# Patient Record
Sex: Female | Born: 1965 | Race: Black or African American | Hispanic: No | Marital: Single | Smoking: Current every day smoker
Health system: Southern US, Community
[De-identification: ages and names within clinical notes are randomized; demographics above are authoritative.]

## PROBLEM LIST (undated history)

## (undated) DIAGNOSIS — G5600 Carpal tunnel syndrome, unspecified upper limb: Secondary | ICD-10-CM

## (undated) DIAGNOSIS — F209 Schizophrenia, unspecified: Principal | ICD-10-CM

## (undated) DIAGNOSIS — I10 Essential (primary) hypertension: Secondary | ICD-10-CM

## (undated) DIAGNOSIS — F1090 Alcohol use, unspecified, uncomplicated: Secondary | ICD-10-CM

## (undated) HISTORY — PX: CHOLECYSTECTOMY: SHX55

## (undated) HISTORY — PX: TUBAL LIGATION: SHX77

---

## 2005-11-05 ENCOUNTER — Emergency Department: Payer: Self-pay | Admitting: Emergency Medicine

## 2005-12-16 ENCOUNTER — Emergency Department: Payer: Self-pay | Admitting: Emergency Medicine

## 2006-03-19 ENCOUNTER — Emergency Department: Payer: Self-pay | Admitting: Emergency Medicine

## 2006-03-30 ENCOUNTER — Inpatient Hospital Stay: Payer: Self-pay | Admitting: Internal Medicine

## 2007-02-24 ENCOUNTER — Emergency Department: Payer: Self-pay | Admitting: Emergency Medicine

## 2008-03-25 ENCOUNTER — Emergency Department: Payer: Self-pay | Admitting: Emergency Medicine

## 2010-06-06 ENCOUNTER — Emergency Department: Payer: Self-pay | Admitting: Internal Medicine

## 2010-08-02 ENCOUNTER — Ambulatory Visit: Payer: Self-pay

## 2010-08-08 ENCOUNTER — Ambulatory Visit: Payer: Self-pay

## 2010-08-16 ENCOUNTER — Ambulatory Visit: Payer: Self-pay | Admitting: General Surgery

## 2010-10-30 ENCOUNTER — Ambulatory Visit: Payer: Self-pay | Admitting: Anesthesiology

## 2010-11-03 ENCOUNTER — Ambulatory Visit: Payer: Self-pay | Admitting: General Surgery

## 2010-11-06 LAB — PATHOLOGY REPORT

## 2012-02-29 ENCOUNTER — Emergency Department: Payer: Self-pay | Admitting: Emergency Medicine

## 2014-04-11 ENCOUNTER — Inpatient Hospital Stay: Payer: Self-pay | Admitting: Internal Medicine

## 2014-04-11 LAB — COMPREHENSIVE METABOLIC PANEL
ALK PHOS: 91 U/L
ANION GAP: 14 (ref 7–16)
AST: 24 U/L
Albumin: 4.1 g/dL
BUN: 9 mg/dL
Bilirubin,Total: 0.4 mg/dL
CALCIUM: 9.6 mg/dL
CHLORIDE: 105 mmol/L
Co2: 18 mmol/L — ABNORMAL LOW
Creatinine: 0.58 mg/dL
EGFR (African American): >60
EGFR (Non-African Amer.): >60
Glucose: 95 mg/dL
POTASSIUM: 4.2 mmol/L
SGPT (ALT): 18 U/L
Sodium: 137 mmol/L
Total Protein: 8.7 g/dL — ABNORMAL HIGH

## 2014-04-11 LAB — CBC
HCT: 42.8 % (ref 35.0–47.0)
HGB: 13.8 g/dL (ref 12.0–16.0)
MCH: 30 pg (ref 26.0–34.0)
MCHC: 32.4 g/dL (ref 32.0–36.0)
MCV: 93 fL (ref 80–100)
PLATELETS: 367 10*3/uL (ref 150–440)
RBC: 4.61 10*6/uL (ref 3.80–5.20)
RDW: 14.4 % (ref 11.5–14.5)
WBC: 19.3 10*3/uL — ABNORMAL HIGH (ref 3.6–11.0)

## 2014-04-12 LAB — CBC WITH DIFFERENTIAL/PLATELET
Basophil #: 0 10*3/uL (ref 0.0–0.1)
Basophil %: 0.1 %
EOS PCT: 0 %
Eosinophil #: 0 10*3/uL (ref 0.0–0.7)
HCT: 40.6 % (ref 35.0–47.0)
HGB: 12.9 g/dL (ref 12.0–16.0)
Lymphocyte #: 1.1 10*3/uL (ref 1.0–3.6)
Lymphocyte %: 6.1 %
MCH: 29.4 pg (ref 26.0–34.0)
MCHC: 31.7 g/dL — ABNORMAL LOW (ref 32.0–36.0)
MCV: 93 fL (ref 80–100)
Monocyte #: 0.2 x10 3/mm (ref 0.2–0.9)
Monocyte %: 1.2 %
Neutrophil #: 16 10*3/uL — ABNORMAL HIGH (ref 1.4–6.5)
Neutrophil %: 92.6 %
PLATELETS: 307 10*3/uL (ref 150–440)
RBC: 4.37 10*6/uL (ref 3.80–5.20)
RDW: 14.4 % (ref 11.5–14.5)
WBC: 17.3 10*3/uL — ABNORMAL HIGH (ref 3.6–11.0)

## 2014-04-12 LAB — HEPATIC FUNCTION PANEL A (ARMC)
ALK PHOS: 85 U/L
Albumin: 3.6 g/dL
Bilirubin,Total: 0.2 mg/dL — ABNORMAL LOW
SGOT(AST): 18 U/L
SGPT (ALT): 14 U/L
Total Protein: 8.4 g/dL — ABNORMAL HIGH

## 2014-04-12 LAB — BASIC METABOLIC PANEL
Anion Gap: 12 (ref 7–16)
BUN: 10 mg/dL
CO2: 21 mmol/L — AB
CREATININE: 0.49 mg/dL
Calcium, Total: 9.2 mg/dL
Chloride: 103 mmol/L
EGFR (African American): >60
Glucose: 157 mg/dL — ABNORMAL HIGH
POTASSIUM: 3.8 mmol/L
Sodium: 136 mmol/L

## 2014-05-16 NOTE — H&P (Signed)
PATIENT NAME:  Beverly West, Beverly West MR#:  [id number] DATE OF BIRTH:  1965/10/02  DATE OF ADMISSION:  04/11/2014  ADMITTING PHYSICIAN: Enid Baasadhika Fatema Rabe, MD.   PRIMARY CARE PHYSICIAN: None.   CHIEF COMPLAINT: Sore throat and left facial swelling.   HISTORY OF PRESENT ILLNESS: Ms Beverly West is a 49 year old, African-American female with no significant past medical history other than smoking and alcohol use, presents to the hospital secondary to worsening sore throat over the last week and left facial swelling that she noticed worse this morning. The patient states that she always had hot flashes. Denies any fevers or chills or out of the ordinary symptoms. Her sore throat started about a week ago and since then, it has been getting worse. She was able to eat and drink fine up until this morning, when she woke up and noticed significant left facial swelling, extending into her neck and she could not even swallow water and presented to the emergency room. She has chronic cough. Nothing has changed. No breathing difficulty. No chest pain or any other symptoms.   CT of her neck done in the emergency room showed left peritonsillar abscess and significant swelling into the epiglottic folds. She was seen by the ENT physician here in the emergency room and attempted drainage, but it was not successful. She is being admitted for further treatment of her abscess.   PAST MEDICAL HISTORY:  1. Tobacco use disorder.  2. History of abnormal liver tests from alcohol abuse, now resolved.  3. Bursitis of her elbow joints. 4. Pinched nerve in her neck.   PAST SURGICAL HISTORY:  1. Tubal ligation.  2. Cholecystectomy.   ALLERGIES TO MEDICATIONS: PENICILLIN AND TETRACYCLINE CAUSE YEAST INFECTION. TYLENOL, SHE WAS ADVISED TO AVOID IN THE PAST WHEN SHE HAD LIVER ABNORMALITIES.   CURRENT HOME MEDICATIONS: None. She has been taking ibuprofen over the counter as needed for her sore throat.   SOCIAL HISTORY:  Lives at home with a friend. Smokes about 1 pack per day. Drinks beer, about 3 cans every day and over the weekends it could be worse, but she used to drink much more in the past.   FAMILY HISTORY: Mother with stomach cancer.   REVIEW OF SYSTEMS:  CONSTITUTIONAL: No fever, fatigue or weakness.  EYES: No blurred vision, double vision, inflammation or glaucoma.  ENT: No tinnitus, ear pain, hearing loss, epistaxis or discharge. Positive for sore throat, dysphagia and odynophagia.  RESPIRATORY: Positive for cough. No COPD, dyspnea, painful respiration or wheezing.  CARDIOVASCULAR: No chest pain, orthopnea, edema, arrhythmia, palpitations or syncope.  GASTROINTESTINAL: No nausea, vomiting, diarrhea, abdominal pain, hematemesis or melena. GENITOURINARY: No dysuria, hematuria, renal calculus, frequency or incontinence.  ENDOCRINE: No polyuria, nocturia, thyroid problems, heat or cold intolerance.  HEMATOLOGY: No anemia, easy bruising or bleeding.  SKIN: No acne, rash or lesions.  MUSCULOSKELETAL: Positive for neck pain. No arthritis or gout.  NEUROLOGICAL: No numbness, weakness, CVA, TIA or seizures.  PSYCHOLOGICAL: No anxiety, insomnia or depression.   PHYSICAL EXAMINATION: VITAL SIGNS: Temperature 98.9 degrees Fahrenheit, pulse 130, respirations 20, blood pressure 172/103, pulse oximetry 99% on room air.  GENERAL: Well-built, well-nourished female lying in bed, not in any acute distress.  HEENT: Normocephalic, atraumatic. Pupils equal, round, reacting to light. Anicteric sclerae. Extraocular movements intact. Oropharynx clear. She has significant tonsillar fossa, lower folds, lower and posterior pharyngeal wall swelling and midline shift of the uvula towards the right side noted. No erythema, mass or exudates noted.  NECK: Supple. There is  soft tissue swelling on the left side of the neck with probable anterior cervical submandibular lymph nodes. No thyromegaly, JVD or carotid bruits. Normal range  of motion of the neck without pain.  LUNGS: Clear to auscultation bilaterally. No wheeze or crackles. No use of accessory muscles for breathing.  CARDIOVASCULAR: S1, S2, regular rate and rhythm. No murmurs, rubs or gallops.  ABDOMEN: Soft, nontender, nondistended. No hepatosplenomegaly. Normal bowel sounds.  EXTREMITIES: No pedal edema. No clubbing or cyanosis, 2+ dorsalis pedis pulses palpable bilaterally.  SKIN: No acne or lesions. No cervical or inguinal lymphadenopathy. Pruritic lichen planus kind of rash with tiny papules noted in both axilla.  NEUROLOGIC: Cranial nerves intact. No focal motor or sensory deficits. The patient is awake, alert, oriented x 3.   LABORATORY DATA: WBC 19.3, hemoglobin 13.3, hematocrit 42.8, platelet count 267,000. Sodium 137, potassium 4.2, chloride 105, bicarbonate 18, BUN 9, creatinine 0.58, glucose 95, and calcium of 9.6. ALT 18, AST 24, alkaline phosphatase 91, total bilirubin 0.4, albumin of 4.1.   DIAGNOSTIC DATA: CT of the neck with contrast showing left peritonsillar abscess involving palatine tonsils, edema, enlarged tonsillar tissue, swelling of left-sided fascial soft tissue involving the submandibular glands, submandibular space and parotid space resulting in left-sided facial asymmetry. Swelling of the pharynx narrowing of the airway. Acute paranasal sinus disease, worse in the right maxillary sinus noted.   ASSESSMENT AND PLAN: A 48 year old female with no significant past medical history, admitted for left peritonsillar abscess.  1. Left peritonsillar abscess appreciate ear, nose throat consult. Laryngoscopy done in the emergency room with no compromise to the airway. Appreciate Dr. Gregary Cromer consult. Attempted drainage, but no significant fluid drained. A lot of soft tissue swelling noted. Please note, the CT findings as dictated above. Started on a liquid diet and advance as tolerated. Started on IV Decadron, clindamycin and pain control for now. Also  started on sliding scale if her sugars get elevated from the Decadron.  2. Hypertension. No known history of hypertension. 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 Sometimes it was high, sometimes it was normal so was not started on a medication. Now the blood pressure is high. It may be pain, anxiety or chronic hypertension. We will start on IV hydralazine p.r.n. and very low dose Norvasc and medications can be adjusted as her blood pressure.  3. Tobacco use disorder. Counseled for treatment and started on Nicotrol inhaler for now.  4. Alcohol abuse. Used to drink a lot before, but significantly cut down. No alcohol dependency seen. Just continue to monitor for now. Not on CIWA protocol.  5. Deep vein thrombosis prophylaxis with subcutaneous heparin.   CODE STATUS: Full code.   TIME SPENT ON ADMISSION: 50 minutes.    ____________________________ Enid Baas, MD rk:TT D: 04/11/2014 15:12:07 ET T: 04/11/2014 15:43:34 ET JOB#: [id number]  cc: Enid Baas, MD, <Dictator> Enid Baas MD ELECTRONICALLY SIGNED 04/22/2014 11:54

## 2014-05-16 NOTE — Consult Note (Signed)
PATIENT NAME:  Beverly West, Beverly West MR#:  [id number] DATE OF BIRTH:  29-Oct-1965  DATE OF CONSULTATION:  04/11/2014  REFERRING PHYSICIAN:  Gladstone Pihavid Schaevitz, M.D.  CONSULTING PHYSICIAN:  Kyung Ruddreighton C. Emina Ribaudo, MD  REASON FOR CONSULTATION: Possible peritonsillar abscess.   HISTORY OF PRESENT ILLNESS: The patient is a 49 year old female with a 1 week history of sore throat with significant worsening over the last 24 hours.  Presented to the Emergency Room, was noted to have uvular swelling and deviation and left-sided tonsillar erythema.  Underwent a CT scan evaluation which revealed a possible peritonsillar abscess versus phlegmon, as well as left-sided sinusitis, as well as posterior nasopharyngeal erythema and some erythema and edema near her left pharynx.  I was asked for evaluation of a peritonsillar abscess as well as secondary to the pharyngeal swelling.   PAST MEDICAL HISTORY: Significant for hypertension, uterine fibroids, reflux, pinched nerve, tobacco abuse, anemia, anxiety, schizophrenia, bursitis.   PAST SURGICAL HISTORY: Tubal ligation, cholecystectomy, wisdom teeth extraction.   FAMILY HISTORY: Reveals no significant easy bleeding or reaction to anesthetics.  ALLERGIES:  ACETAMINOPHEN, PENICILLIN, AND TETRACYCLINE.  CURRENT MEDICATIONS:  None.  SOCIAL HISTORY:  Patient has a history of tobacco abuse, is currently unemployed.   LABORATORY DATA:  White counts is 19.3.    PHYSICAL EXAMINATION:  VITAL SIGNS: Temperature is 98.9, pulse of 110, respirations 18, blood pressure 154/105, pulse oximetry is 97.  GENERAL: She is a well-nourished female lying supine in bed and no stridor or stertor.    EARS: External ears are clear.  NOSE: Reveals some anterior congestion, left greater than right. ORAL CAVITY AND OROPHARYNX:  Reveals some erythema of her tonsils, left greater than right, and some exudate along her left.  There is deviation of the uvula and with the uvula thickening and  widening.  At left level 1 and 2, she has some fullness and some shoddy lymphadenopathy bilaterally.   IMAGING:  CT scan is reviewed which reveals hypodensity within the left tonsil as well as well as adjacent and superior to the left tonsil and the left nasopharynx as well as in the pharynx involving the arytenoid  and eryepiglottic fold on the left. She was also noted to have a left-sided maxillary sinusitis.  PROCEDURE:  Incision and drainage of a peritonsillar abscess.   PREPROCEDURE DIAGNOSIS: Peritonsillar abscess/phlegmon.   POSTPROCEDURE DISEASE:  Peritonsillar abscess/phlegmon.   DESCRIPTION OF PROCEDURE: After verbal consent was obtained, the patient's oral cavity was anesthetized with topical Hurricaine spray and then 1 mL of 1% lidocaine with 1:200,000 epinephrine was injected adjacent to the patient's left tonsil. An 4918 gauge seeker needle was then inserted superior and lateral to the left tonsil, then more inferiorly and then more medially and then again more inferiorly.  There was a scant amount of purulent drainage which was removed adjacent the left tonsil.  Then a seeker needle was placed within the substance of the left tonsil and then more inferiorly from the left tonsil with only a scant amount of purulent drainage seen.   IMPRESSION: Peritonsillar phlegmon and pharyngeal swelling as well as nasopharyngeal swelling.   PLAN: I discussed my findings with the patient of the laryngoscopy which was dictated as a separate operative report. Her airway is fine; however, given the significant amount of swelling I would recommend observation.  Her blood pressure was also significantly elevated extending up into the 190s /130s during my evaluation and discussion with the patient.  Antibiotic such as intravenous clindamycin as well as continuation  of the Decadron would be beneficial. I will defer to medicine in regards to treatment of the blood pressure and pulse.  I appreciate their assistant  in taking care of this patient.  I will continue to follow along very closely and consider additional procedure including a repeat incision and drainage depending on how the patient progresses.    ____________________________ Kyung Rudd, MD ccv:sp D: 04/11/2014 14:35:28 ET T: 04/11/2014 15:40:49 ET JOB#: [id number]  cc: Kyung Rudd, MD, <Dictator> Kyung Rudd MD ELECTRONICALLY SIGNED 04/28/2014 17:33

## 2014-05-16 NOTE — Op Note (Signed)
PATIENT NAME:  Beverly West, [name] MR#:  [id number] DATE OF BIRTH:  1965/05/10  DATE OF PROCEDURE:  04/11/2014  PREOPERATIVE DIAGNOSES: Pharyngeal swelling.   POSTOPERATIVE DIAGNOSIS: Pharyngeal swelling.   PROCEDURE PERFORMED: Transnasal flexible laryngoscopy.   ANESTHESIA: Topical lidocaine and oxymetazoline.   ESTIMATED BLOOD LOSS: Zero.   INTRAVENOUS FLUIDS: Zero.   SPECIMENS: None.   DRAINS PLACED: None.   INDICATIONS FOR PROCEDURE: The patient is a 49 year old female with history of possible peritonsillar abscess, found to have pharyngeal swelling on CT scan. The patient also has odynophagia and dysphagia.   OPERATIVE FINDINGS: Moderate left-sided pharyngeal and oropharyngeal swelling, widely patent airway. No abscess seen.   DESCRIPTION OF PROCEDURE: After the patient was identified verbal consent was obtained, the patient's nasal cavity was anesthetized with topical oxymetazoline and viscous lidocaine jelly. Transnasal flexible laryngoscopy was performed along the patient's left nasal cavity. This demonstrated some erythema from her left nasopharynx near the fossa of Rosenmuller, extending down adjacent to the left oropharyngeal tonsil, extending down to the left pharynx onto the arytenoid on the left side. There was some boggy mucosa prolapsing into the post cricoid region. True vocal folds were easily visualized in their entirety and the post cricoid region demonstrated some mild erythema. The anterior glottis, the right glottis as well as the trachea was visualized and noted be normal in appearance.   IMPRESSION: Left-sided nasopharyngitis and oropharyngitis, consistent tonsillar phlegmon.   ____________________________ Kyung Ruddreighton C. Markeshia Giebel, MD ccv:TT D: 04/11/2014 14:28:38 ET T: 04/11/2014 15:39:15 ET JOB#: [id number]  cc: Kyung Ruddreighton C. Keylen Uzelac, MD, <Dictator> Kyung RuddREIGHTON C Rosalyn Archambault MD ELECTRONICALLY SIGNED 04/28/2014 17:33

## 2014-05-16 NOTE — Discharge Summary (Signed)
PATIENT NAME:  Beverly West, Beverly West MR#:  [id number] DATE OF BIRTH:  [birth year]  DATE OF ADMISSION:  04/11/2014 DATE OF DISCHARGE:  04/12/2014  PRESENTING COMPLAINT: Neck pain.    DISCHARGE DIAGNOSIS: Left peritonsillar abscess, status post surgical incision and drainage.   CODE STATUS: Full code.   MEDICATIONS:  1.  Sterapred 1 per tapering dose.  2.  Metoprolol 25 mg b.i.d.  3.  Clindamycin 300 mg p.o. 2 capsules every 8 hourly.   DIET: Mechanical soft diet.   FOLLOWUP PLAN:  1.  Follow up with [facility] 2 to 4 weeks.  2.  Follow up with Dr. Andee PolesVaught as a hospital follow up.   LABORATORY DATA: White count was 17.3.   BRIEF SUMMARY OF HOSPITAL COURSE: Mrs. Beverly West is a 49 year old [place] female who came in with some neck pain, was found to have:  1.  Left peritonsillar abscess, status post surgical drainage, doing well, tolerates p.o. diet, was started on Sterapred DS taper and clindamycin. She will follow up with Dr. Andee PolesVaught as outpatient.  2.  Hypertension, new. Started on beta blockers.  3.  Tobacco abuse disorder. Counseled on smoking cessation.  4.  Alcohol use disorder. No signs of withdrawal. She is stable at this time.  5.  Deep vein thrombosis prophylaxis. She is ambulatory.   Overall hospital stay otherwise remained stable. [name].   TIME SPENT: 40 minutes.   ____________________________ Wylie HailSona A. Allena KatzPatel, MD sap:AT D: 04/12/2014 16:11:45 ET T: 04/13/2014 06:23:40 ET JOB#: [id number]  cc: Beverly West A. Allena KatzPatel, MD, <Dictator> Beverly OraSONA A Child Campoy MD ELECTRONICALLY SIGNED 04/20/2014 15:56

## 2014-05-16 NOTE — Consult Note (Signed)
Chief Complaint:  Subjective/Chief Complaint Improved pain this morning.  No respiratory issues.  Swallowing better.  Tolerating diet.  Ambulating.   VITAL SIGNS/ANCILLARY NOTES: **Vital Signs.:   28-Mar-16 07:55  Vital Signs Type Routine  Temperature Temperature (F) 97.5  Celsius 36.3  Temperature Source oral  Pulse Pulse 72  Respirations Respirations 18  Systolic BP Systolic BP 166  Diastolic BP (mmHg) Diastolic BP (mmHg) 105  Mean BP 125  Pulse Ox % Pulse Ox % 98  Pulse Ox Activity Level  At rest  Oxygen Delivery Room Air/ 21 %   Brief Assessment:  GEN well developed, well nourished, no acute distress   Cardiac Regular   Respiratory normal resp effort   Additional Physical Exam OC/OP- significantly improved edema and erythema of uvula and left tonsil.  continued hypertrophy of left tonsil and fullness adjacent to it Neck- supple with resolved gtenderness to left side.  continued mild lymphadenopathy   Assessment/Plan:  Assessment/Plan:  Assessment Peritonsillar phlegmon/early abcess   Plan 1)  Given significant improvement, can change to oral clindamycin and oral Sterapred DS 6 day taper. 2)  Defer to medicine management of HTN 3)  OK to discharge given signficant improvement and follow up with me in 7 days for repeat evaluation.  Discussed with patient what to do if symptoms worsen.   Electronic Signatures: Tramaine Snell, Rayfield Citizenreighton Charles (MD)  (Signed [id number]-Mar-16 08:02)  Authored: Chief Complaint, VITAL SIGNS/ANCILLARY NOTES, Brief Assessment, Assessment/Plan   Last Updated: 28-Mar-16 08:02 by Flossie DibbleVaught, Livana Yerian Charles (MD)

## 2014-09-13 ENCOUNTER — Emergency Department
Admission: EM | Admit: 2014-09-13 | Discharge: 2014-09-13 | Disposition: A | Discharge status: Home / Self Care | Payer: Non-veteran care | Attending: Emergency Medicine | Admitting: Emergency Medicine

## 2014-09-13 DIAGNOSIS — J36 Peritonsillar abscess: Secondary | ICD-10-CM | POA: Insufficient documentation

## 2014-09-13 DIAGNOSIS — Z79899 Other long term (current) drug therapy: Secondary | ICD-10-CM | POA: Insufficient documentation

## 2014-09-13 DIAGNOSIS — Z7951 Long term (current) use of inhaled steroids: Secondary | ICD-10-CM | POA: Insufficient documentation

## 2014-09-13 DIAGNOSIS — R599 Enlarged lymph nodes, unspecified: Secondary | ICD-10-CM | POA: Insufficient documentation

## 2014-09-13 DIAGNOSIS — Z720 Tobacco use: Secondary | ICD-10-CM | POA: Insufficient documentation

## 2014-09-13 DIAGNOSIS — Z880 Allergy status to penicillin: Secondary | ICD-10-CM | POA: Insufficient documentation

## 2014-09-13 DIAGNOSIS — I10 Essential (primary) hypertension: Secondary | ICD-10-CM | POA: Insufficient documentation

## 2014-09-13 HISTORY — DX: Essential (primary) hypertension: I10

## 2014-09-13 HISTORY — DX: Carpal tunnel syndrome, unspecified upper limb: G56.00

## 2014-09-13 LAB — BASIC METABOLIC PANEL
Anion gap: 10 (ref 5–15)
BUN: 12 mg/dL (ref 6–20)
CALCIUM: 9.9 mg/dL (ref 8.9–10.3)
CHLORIDE: 104 mmol/L (ref 101–111)
CO2: 25 mmol/L (ref 22–32)
CREATININE: 0.76 mg/dL (ref 0.44–1.00)
GFR calc non Af Amer: >60 mL/min (ref >60)
Glucose, Bld: 87 mg/dL (ref 65–99)
Potassium: 3.4 mmol/L — ABNORMAL LOW (ref 3.5–5.1)
SODIUM: 139 mmol/L (ref 135–145)

## 2014-09-13 LAB — CBC WITH DIFFERENTIAL/PLATELET
BASOS PCT: 1 %
Basophils Absolute: 0.1 10*3/uL (ref 0–0.1)
EOS ABS: 0.3 10*3/uL (ref 0–0.7)
EOS PCT: 3 %
HCT: 43.1 % (ref 35.0–47.0)
Hemoglobin: 14.2 g/dL (ref 12.0–16.0)
LYMPHS ABS: 2.5 10*3/uL (ref 1.0–3.6)
Lymphocytes Relative: 29 %
MCH: 30.2 pg (ref 26.0–34.0)
MCHC: 32.9 g/dL (ref 32.0–36.0)
MCV: 91.8 fL (ref 80.0–100.0)
MONOS PCT: 9 %
Monocytes Absolute: 0.8 10*3/uL (ref 0.2–0.9)
Neutro Abs: 5 10*3/uL (ref 1.4–6.5)
Neutrophils Relative %: 58 %
PLATELETS: 195 10*3/uL (ref 150–440)
RBC: 4.69 MIL/uL (ref 3.80–5.20)
RDW: 12.9 % (ref 11.5–14.5)
WBC: 8.6 10*3/uL (ref 3.6–11.0)

## 2014-09-13 MED ORDER — CLINDAMYCIN HCL 300 MG PO CAPS: 300.0000 mg | ORAL_CAPSULE | Freq: Three times a day (TID) | ORAL | Status: AC

## 2014-09-13 MED ORDER — SODIUM CHLORIDE 0.9 % IV BOLUS (SEPSIS)
500.0000 mL | Freq: Once | INTRAVENOUS | Status: AC
  Administered 2014-09-13: 500 mL via INTRAVENOUS

## 2014-09-13 MED ORDER — PREDNISONE 20 MG PO TABS: 20.0000 mg | ORAL_TABLET | Freq: Two times a day (BID) | ORAL | Status: AC

## 2014-09-13 MED ORDER — METHYLPREDNISOLONE SODIUM SUCC 125 MG IJ SOLR
125.0000 mg | Freq: Once | INTRAMUSCULAR | Status: AC
  Administered 2014-09-13: 125 mg via INTRAVENOUS
  Filled 2014-09-13: qty 2

## 2014-09-13 MED ORDER — KETOROLAC TROMETHAMINE 30 MG/ML IJ SOLN
30.0000 mg | Freq: Once | INTRAMUSCULAR | Status: AC
  Administered 2014-09-13: 30 mg via INTRAVENOUS
  Filled 2014-09-13: qty 1

## 2014-09-13 MED ORDER — CLINDAMYCIN PHOSPHATE 900 MG/50ML IV SOLN
900.0000 mg | Freq: Once | INTRAVENOUS | Status: AC
  Administered 2014-09-13: 900 mg via INTRAVENOUS
  Filled 2014-09-13: qty 50

## 2014-09-13 NOTE — Discharge Instructions (Signed)
Peritonsillar Abscess Peritonsillar abscess is a collection of yellowish white fluid (pus) in the back of the throat. This fluid forms behind the tonsils. The treatment is most often drainage. This is done by:  Putting a needle into the abscess.  Cutting and draining the abscess. HOME CARE  If your abscess was drained today:  Mix 1 teaspoon of salt in 8 ounces of warm water for gargling.  Gargle the warm salt water.  Gargle 4 times per day or as needed for comfort. Do not swallow this mixture.  Rest in bed as needed.  Return to normal activity as soon as you can.  Apply cold to your neck for pain relief.  Put ice in a plastic bag.  Place a towel between your skin and the bag.  Leave the ice on for 15-20 minutes at a time, 03-04 times a day.  Eat a soft or liquid diet. Drink cold fluids. Cold fluids will soothe and take puffiness (swelling) down.  Only take medicine as told by your doctor.  Take all medicine as told. GET HELP RIGHT AWAY IF:   You are coughing up or throwing up (vomiting) blood.  Your throat pain is severe and pain medicine does not help it.  You have trouble talking or breathing.  You have trouble swallowing or eating.  You find it easier to breathe while leaning forward.  You have pain that gets worse.  You have pain, puffiness, redness, or drainage in your throat.  You have a fever.  You become dizzy, have a headache, have low energy, or feel sick.  You have signs of body fluid loss (dehydration). This includes lightheadedness when standing, peeing (urinating) less, a fast heart rate, or dry mouth and nose.  You start to drool. MAKE SURE YOU:  Understand these instructions.  Will watch your condition.  Will get help if you are not doing well or get worse. Document Released: 12/20/2008 Document Revised: 03/26/2011 Document Reviewed: 12/20/2008 [facility] Patient Information 2015 [place], [place]. This information is not intended to replace  advice given to you by your health care provider. Make sure you discuss any questions you have with your health care provider.  Please return immediately if condition worsens. Please contact her primary physician or the physician you were given for referral. If you have any specialist physicians involved in her treatment and plan please also contact them. Thank you for using [place] [facility] emergency [facility].

## 2014-09-13 NOTE — ED Provider Notes (Signed)
Time Seen: Approximately ----------------------------------------- 3:45 PM on 09/13/2014 -----------------------------------------   I have reviewed the triage notes  Chief Complaint: Sore Throat   History of Present Illness: Beverly West is a 49 y.o. female who presents with sore throat. Patient has significant history of peritonsillar abscess that was attempted to be needle drained she states. Stay overnight in the hospital received IV antibiotics and steroids. She did follow up with an ENT. This all occurred 3 months ago. He states he had a scope which did not show any signs of cancer etc. She states that she's having pain and swelling in the same location that does not seem to be as bad as it was before when she was here 3 months ago. Low-grade fever at home she states is painful to swallow. She does have a muffled voice but is able to breathe without any difficulty. Patient states that she was on clindamycin which seemed to work for her and had no significant adverse effects.   Past Medical History  Diagnosis Date  . Hypertension   . Carpal tunnel syndrome     There are no active problems to display for this patient.   Past Surgical History  Procedure Laterality Date  . Cholecystectomy    . Tubal ligation      Past Surgical History  Procedure Laterality Date  . Cholecystectomy    . Tubal ligation      Current Outpatient Rx  Name  Route  Sig  Dispense  Refill  . amLODipine (NORVASC) 10 MG tablet   Oral   Take 10 mg by mouth daily.         . carboxymethylcellulose 1 % ophthalmic solution      1 drop QID.         . fluticasone (VERAMYST) 27.5 MCG/SPRAY nasal spray   Nasal   Place 2 sprays into the nose daily.         Marland Kitchen ketotifen (ZADITOR) 0.025 % ophthalmic solution   Both Eyes   Place 1 drop into both eyes 2 (two) times daily.         Marland Kitchen loratadine (CLARITIN) 10 MG tablet   Oral   Take 10 mg by mouth daily.           Allergies:   Penicillins and Tetracyclines & related  Family History: No family history on file.  Social History: Social History  Substance Use Topics  . Smoking status: Current Every Day Smoker  . Smokeless tobacco: None  . Alcohol Use: Yes     Comment: rarely     Review of Systems:   10 point review of systems was performed and was otherwise negative:  Constitutional: No fever Eyes: No visual disturbances ENT: No sore throat, ear pain Cardiac: No chest pain Respiratory: No shortness of breath, wheezing, or stridor Abdomen: No abdominal pain, no vomiting, No diarrhea Endocrine: No weight loss, No night sweats Extremities: No peripheral edema, cyanosis Skin: No rashes, easy bruising Neurologic: No focal weakness, trouble with speech or swollowing Urologic: No dysuria, Hematuria, or urinary frequency   Physical Exam:  ED Triage Vitals  Enc Vitals Group     BP 09/13/14 1423 161/117 mmHg     Pulse Rate 09/13/14 1423 85     Resp 09/13/14 1423 17     Temp 09/13/14 1423 99.5 F (37.5 C)     Temp Source 09/13/14 1423 Oral     SpO2 09/13/14 1423 100 %     Weight 09/13/14 1423  160 lb (72.576 kg)     Height 09/13/14 1423  (1.651 m)     Head Cir --      Peak Flow --      Pain Score 09/13/14 1425 8     Pain Loc --      Pain Edu? --      Excl. in GC? --     General: Awake , Alert , and Oriented times 3; GCS 15 Head: Normal cephalic , atraumatic Eyes: Pupils equal , round, reactive to light Nose/Throat: Patient has a patent upper airway with clear visualization of a midline uvula. There is swelling and erythema in the left peritonsillar area without any bulging or drainage. She has some mild left-sided adenopathy with no meningeal signs. Neck: No stridor Supple, Full range of motion, No anterior adenopathy or palpable thyroid masses Lungs: Clear to ascultation without wheezes , rhonchi, or rales Heart: Regular rate, regular rhythm without murmurs , gallops , or rubs Abdomen:  Soft, non tender without rebound, guarding , or rigidity; bowel sounds positive and symmetric in all 4 quadrants. No organomegaly .        Extremities: 2 plus symmetric pulses. No edema, clubbing or cyanosis Neurologic: normal ambulation, Motor symmetric without deficits, sensory intact Skin: warm, dry, no rashes   Labs:   All laboratory work was reviewed including any pertinent negatives or positives listed below:  Labs Reviewed  BASIC METABOLIC PANEL  CBC WITH DIFFERENTIAL/PLATELET   Review of laboratory work shows no significant abnormalities  ED Course:  Patient remained hemodynamically stable and I felt her airway was stable at this time. Patient had an IV established and was given clindamycin dosage at 900 mg IV 1. Patient's otherwise stable and I felt could be treated on an outpatient basis and received 125 mg of Solu-Medrol. The abscess did not have an parents that require emergent draining at this point. Patient's followed by an ENT at the [facility] system and was advised to call him later today and schedule an appointment as soon as possible. She was advised that she can start her antibiotics and also her steroids tomorrow. She states she really feels symptomatically improved at this point. She was also advised take over-the-counter ibuprofen and was warned if it irritates her stomach that she should switch to Tylenol.   Assessment: Left-sided peritonsillar abscess      Plan:  Patient was advised to return immediately if condition worsens. Patient was advised to follow up with her primary care physician or other specialized physicians involved and in their current assessment.           Go to top  Jennye Moccasin, MD 09/13/14 [phone]

## 2014-09-13 NOTE — ED Notes (Signed)
Patient began with sore throat on Saturday. Was seen here about 3 months ago with abscess in left side of throat. Left side is painful today. Painful to swallow.

## 2016-09-19 ENCOUNTER — Emergency Department: Payer: Non-veteran care

## 2016-09-19 ENCOUNTER — Emergency Department
Admission: EM | Admit: 2016-09-19 | Discharge: 2016-09-19 | Disposition: A | Discharge status: Home / Self Care | Payer: Non-veteran care | Attending: Emergency Medicine | Admitting: Emergency Medicine

## 2016-09-19 DIAGNOSIS — Z79899 Other long term (current) drug therapy: Secondary | ICD-10-CM | POA: Insufficient documentation

## 2016-09-19 DIAGNOSIS — F17210 Nicotine dependence, cigarettes, uncomplicated: Secondary | ICD-10-CM | POA: Diagnosis not present

## 2016-09-19 DIAGNOSIS — M62838 Other muscle spasm: Secondary | ICD-10-CM | POA: Insufficient documentation

## 2016-09-19 DIAGNOSIS — R569 Unspecified convulsions: Secondary | ICD-10-CM | POA: Diagnosis not present

## 2016-09-19 DIAGNOSIS — I10 Essential (primary) hypertension: Secondary | ICD-10-CM | POA: Diagnosis not present

## 2016-09-19 DIAGNOSIS — R0602 Shortness of breath: Secondary | ICD-10-CM | POA: Insufficient documentation

## 2016-09-19 DIAGNOSIS — E860 Dehydration: Secondary | ICD-10-CM | POA: Insufficient documentation

## 2016-09-19 DIAGNOSIS — R0603 Acute respiratory distress: Secondary | ICD-10-CM | POA: Diagnosis present

## 2016-09-19 DIAGNOSIS — E876 Hypokalemia: Secondary | ICD-10-CM

## 2016-09-19 LAB — BASIC METABOLIC PANEL
ANION GAP: 11 (ref 5–15)
BUN: 15 mg/dL (ref 6–20)
CHLORIDE: 98 mmol/L — AB (ref 101–111)
CO2: 25 mmol/L (ref 22–32)
Calcium: 7.6 mg/dL — ABNORMAL LOW (ref 8.9–10.3)
Creatinine, Ser: 0.81 mg/dL (ref 0.44–1.00)
GFR calc Af Amer: >60 mL/min (ref >60)
Glucose, Bld: 82 mg/dL (ref 65–99)
POTASSIUM: 2.8 mmol/L — AB (ref 3.5–5.1)
SODIUM: 134 mmol/L — AB (ref 135–145)

## 2016-09-19 LAB — URINALYSIS, ROUTINE W REFLEX MICROSCOPIC
BACTERIA UA: NONE SEEN
BILIRUBIN URINE: NEGATIVE
GLUCOSE, UA: NEGATIVE mg/dL
Ketones, ur: 5 mg/dL — AB
LEUKOCYTES UA: NEGATIVE
Nitrite: NEGATIVE
PROTEIN: 100 mg/dL — AB
Specific Gravity, Urine: 1.014 (ref 1.005–1.030)
pH: 7 (ref 5.0–8.0)

## 2016-09-19 LAB — COMPREHENSIVE METABOLIC PANEL
ALT: 42 U/L (ref 14–54)
AST: 102 U/L — ABNORMAL HIGH (ref 15–41)
Albumin: 4.3 g/dL (ref 3.5–5.0)
Alkaline Phosphatase: 65 U/L (ref 38–126)
Anion gap: 31 — ABNORMAL HIGH (ref 5–15)
BUN: 19 mg/dL (ref 6–20)
CHLORIDE: 89 mmol/L — AB (ref 101–111)
CO2: 15 mmol/L — AB (ref 22–32)
CREATININE: 1.64 mg/dL — AB (ref 0.44–1.00)
Calcium: 9 mg/dL (ref 8.9–10.3)
GFR calc non Af Amer: 35 mL/min — ABNORMAL LOW (ref >60)
GFR, EST AFRICAN AMERICAN: 41 mL/min — AB (ref >60)
Glucose, Bld: 167 mg/dL — ABNORMAL HIGH (ref 65–99)
Potassium: 2.8 mmol/L — ABNORMAL LOW (ref 3.5–5.1)
SODIUM: 135 mmol/L (ref 135–145)
Total Bilirubin: 0.9 mg/dL (ref 0.3–1.2)
Total Protein: 8.9 g/dL — ABNORMAL HIGH (ref 6.5–8.1)

## 2016-09-19 LAB — BLOOD GAS, VENOUS
Acid-base deficit: 3.7 mmol/L — ABNORMAL HIGH (ref 0.0–2.0)
BICARBONATE: 18.2 mmol/L — AB (ref 20.0–28.0)
O2 SAT: 96.5 %
PATIENT TEMPERATURE: 37
pCO2, Ven: 25 mmHg — ABNORMAL LOW (ref 44.0–60.0)
pH, Ven: 7.47 — ABNORMAL HIGH (ref 7.250–7.430)
pO2, Ven: 80 mmHg — ABNORMAL HIGH (ref 32.0–45.0)

## 2016-09-19 LAB — CBC WITH DIFFERENTIAL/PLATELET
BASOS PCT: 0 %
Basophils Absolute: 0 10*3/uL (ref 0–0.1)
EOS PCT: 0 %
Eosinophils Absolute: 0 10*3/uL (ref 0–0.7)
HCT: 42 % (ref 35.0–47.0)
HEMOGLOBIN: 14.3 g/dL (ref 12.0–16.0)
Lymphocytes Relative: 58 %
Lymphs Abs: 5.4 10*3/uL — ABNORMAL HIGH (ref 1.0–3.6)
MCH: 30.5 pg (ref 26.0–34.0)
MCHC: 34 g/dL (ref 32.0–36.0)
MCV: 89.8 fL (ref 80.0–100.0)
MONO ABS: 1.1 10*3/uL — AB (ref 0.2–0.9)
MONOS PCT: 12 %
NEUTROS PCT: 30 %
Neutro Abs: 2.8 10*3/uL (ref 1.4–6.5)
Platelets: 235 10*3/uL (ref 150–440)
RBC: 4.68 MIL/uL (ref 3.80–5.20)
RDW: 13.9 % (ref 11.5–14.5)
WBC: 9.3 10*3/uL (ref 3.6–11.0)

## 2016-09-19 LAB — LACTIC ACID, PLASMA
Lactic Acid, Venous: 14.5 mmol/L (ref 0.5–1.9)
Lactic Acid, Venous: 2.1 mmol/L (ref 0.5–1.9)

## 2016-09-19 LAB — TROPONIN I

## 2016-09-19 MED ORDER — SODIUM CHLORIDE 0.9 % IV BOLUS (SEPSIS)
250.0000 mL | Freq: Once | INTRAVENOUS | Status: AC
  Administered 2016-09-19: 250 mL via INTRAVENOUS

## 2016-09-19 MED ORDER — SODIUM CHLORIDE 0.9 % IV BOLUS (SEPSIS)
1000.0000 mL | Freq: Once | INTRAVENOUS | Status: AC
  Administered 2016-09-19: 1000 mL via INTRAVENOUS

## 2016-09-19 MED ORDER — ACETAMINOPHEN 325 MG PO TABS
650.0000 mg | ORAL_TABLET | Freq: Once | ORAL | Status: AC
  Administered 2016-09-19: 650 mg via ORAL
  Filled 2016-09-19: qty 2

## 2016-09-19 MED ORDER — POTASSIUM CHLORIDE ER 20 MEQ PO TBCR
20.0000 meq | EXTENDED_RELEASE_TABLET | Freq: Two times a day (BID) | ORAL | 0 refills | Status: DC
Start: 2016-09-20 — End: 2023-02-21

## 2016-09-19 MED ORDER — POTASSIUM CHLORIDE CRYS ER 20 MEQ PO TBCR
40.0000 meq | EXTENDED_RELEASE_TABLET | Freq: Once | ORAL | Status: AC
  Administered 2016-09-19: 40 meq via ORAL
  Filled 2016-09-19: qty 2

## 2016-09-19 MED ORDER — ALBUTEROL SULFATE (2.5 MG/3ML) 0.083% IN NEBU: 5.0000 mg | INHALATION_SOLUTION | Freq: Once | RESPIRATORY_TRACT | Status: DC

## 2016-09-19 MED ORDER — POTASSIUM CHLORIDE 10 MEQ/100ML IV SOLN
10.0000 meq | Freq: Once | INTRAVENOUS | Status: AC
  Administered 2016-09-19: 10 meq via INTRAVENOUS
  Filled 2016-09-19: qty 100

## 2016-09-19 NOTE — ED Notes (Signed)
Pt returned from CT °

## 2016-09-19 NOTE — ED Triage Notes (Signed)
Pt presents to ED via [facility] from home. Pt is diaphoretic upon arrival. EMS got the call for "seizure" pt has no hx seizure. EMS states arms were curled in with legs straight when EMS arrived. Pt states R wrist pain d/t carpal tunnel. EMS reports pt was hyperventilating x 5 minutes upon arrival. End tidal CO2 12, placed on nonrebreather at 12 L and CO2 16. Upon arrival pt CO2 back down to 12. Placed back on oxygen. Pt CBG 153. Pt took BP medications this AM. States she has felt sick x few days, vomiting yesterday. EMS states pt has been taking alka seltzer. Pt states she has seasonal allergies.

## 2016-09-19 NOTE — Discharge Instructions (Signed)
Return to the ER for new or worsening spasm or stiffness, any shaking or seizure, weakness, lightheadedness, fever, chest pain, difficulty breathing, or any new or worsening symptoms that concern you.  Follow-up with your primary care within 1 week. You should take the potassium until you are re-evaluated and can have it rechecked.

## 2016-09-19 NOTE — ED Notes (Signed)
Date and time results received: 09/19/16 1730  (use smartphrase ".now" to insert current time)  Test: Lactic Acid Critical Value: 2.1  Name of Provider Notified: Dr. Marisa SeverinSiadecki  Orders Received? Or Actions Taken?:

## 2016-09-19 NOTE — ED Provider Notes (Signed)
[facility] Emergency Department Provider Note ____________________________________________   First MD Initiated Contact with Patient 09/19/16 1304     (approximate)  I have reviewed the triage vital signs and the nursing notes.   HISTORY  Chief Complaint Respiratory Distress    HPI Beverly West is a 51 y.o. female with a history of hypertension, allergies, carpal tunnel presents with an episode of apparent respiratory distress, acute onset while patient was lying on her sofa and lasting several minutes. The episode is now resolved. Patient states over the last few days she has been feeling nasal congestion, mild cough, and cold-like symptoms, but then today acutely became both short of breath and stiff, where she felt like her arms and legs were flexed and she could not move them for several minutes. She was conscious throughout this episode did not feel dizzy or lightheaded. Per EMS, patient was noted to have O2 sat in the low 90s, low end-tidal CO2, and initial heart rate in the 70s that then went to the 130s.  Patient denies any prior history of episodes like this today.    Past Medical History:  Diagnosis Date  . Carpal tunnel syndrome   . Hypertension     There are no active problems to display for this patient.   Past Surgical History:  Procedure Laterality Date  . CHOLECYSTECTOMY    . TUBAL LIGATION      Prior to Admission medications   Medication Sig Start Date End Date Taking? Authorizing Provider  amLODipine (NORVASC) 10 MG tablet Take 10 mg by mouth daily.    [provider]  carboxymethylcellulose 1 % ophthalmic solution 1 drop QID.    [provider]  fluticasone (VERAMYST) 27.5 MCG/SPRAY nasal spray Place 2 sprays into the nose daily.    [provider]  ketotifen (ZADITOR) 0.025 % ophthalmic solution Place 1 drop into both eyes 2 (two) times daily.    [provider]  loratadine  (CLARITIN) 10 MG tablet Take 10 mg by mouth daily.    [provider]    Allergies Penicillins and Tetracyclines & related  History reviewed. No pertinent family history.  Social History Social History  Substance Use Topics  . Smoking status: Current Every Day Smoker  . Smokeless tobacco: Not on file  . Alcohol use Yes     Comment: rarely    Review of Systems  Constitutional: No subjective fever/chills Eyes: No visual changes. ENT: No sore throat, positive for nasal congestion.  Cardiovascular: Denies chest pain or palpitations.  Respiratory: Positive for resolving shortness of breath. Gastrointestinal: No nausea, no vomiting.  No diarrhea.  Genitourinary: Negative for dysuria.  Musculoskeletal: Negative for back pain. Skin: Negative for rash. Neurological: Negative for headaches, focal weakness or numbness.  Positive for episode of stiff limbs.    ____________________________________________   PHYSICAL EXAM:  VITAL SIGNS: ED Triage Vitals [09/19/16 1306]  Enc Vitals Group     BP      Pulse Rate (!) 143     Resp (!) 30     Temp 99 F (37.2 C)     Temp Source Oral     SpO2 97 %     Weight      Height      Head Circumference      Peak Flow      Pain Score 6     Pain Loc      Pain Edu?      Excl. in GC?  Constitutional: Alert and oriented. Diaphoretic, but in no distress. Eyes: Conjunctivae are normal.  Head: Atraumatic. Nose: No congestion/rhinnorhea. Mouth/Throat: Mucous membranes are dry. .   Neck: Normal range of motion.  Cardiovascular: Tachycardic, regular rhythm. Grossly normal heart sounds.  Good peripheral circulation. Respiratory: Normal respiratory effort.  No retractions. Lungs CTAB. Gastrointestinal: Soft and nontender. No distention.  Genitourinary: No CVA tenderness. Musculoskeletal: No lower extremity edema.  Extremities warm and well perfused.  Neurologic:  Normal speech and language. No gross focal neurologic deficits are  appreciated.  Motor and sensory intact in all extremities.  Normal coordination.  Skin:  Skin is warm and diaphoretic. No rash noted. Psychiatric: Mood and affect are normal. Speech and behavior are normal.  ____________________________________________   LABS (all labs ordered are listed, but only abnormal results are displayed)  Labs Reviewed  CULTURE, BLOOD (ROUTINE X 2)  CULTURE, BLOOD (ROUTINE X 2)  COMPREHENSIVE METABOLIC PANEL  CBC WITH DIFFERENTIAL/PLATELET  URINALYSIS, ROUTINE W REFLEX MICROSCOPIC  LACTIC ACID, PLASMA  LACTIC ACID, PLASMA  TROPONIN I  BLOOD GAS, VENOUS   ____________________________________________  EKG  ED ECG REPORT I, Dionne Bucy, the attending physician, personally viewed and interpreted this ECG.  Date: 09/19/2016 EKG Time: 1308 Rate: 139 Rhythm: sinus tachycardia QRS Axis: normal Intervals: normal ST/T Wave abnormalities: slight T-wave flattening inferior leads Narrative Interpretation: sinus tach, no evidence of acute ischemia  ____________________________________________  RADIOLOGY  Chest X-ray shows no active cardiopulmonary disease.  CT head with no acute findings. ____________________________________________   PROCEDURES  Procedure(s) performed: No    Critical Care performed: Yes  CRITICAL CARE Performed by: Dionne Bucy   Total critical care time: 60 minutes  Critical care time was exclusive of separately billable procedures and treating other patients.  Critical care was necessary to treat or prevent imminent or life-threatening deterioration.  Critical care was time spent personally by me on the following activities: development of treatment plan with patient and/or surrogate as well as nursing, discussions with consultants, evaluation of patient's response to treatment, examination of patient, obtaining history from patient or surrogate, ordering and performing treatments and interventions, ordering  and review of laboratory studies, ordering and review of radiographic studies, pulse oximetry and re-evaluation of patient's condition.   ____________________________________________   INITIAL IMPRESSION / ASSESSMENT AND PLAN / ED COURSE  Pertinent labs & imaging results that were available during my care of the patient were reviewed by me and considered in my medical decision making (see chart for details).  51 year old female past medical history as noted presents with an episode of respiratory distress and stiffness in her extremities.. This is now resolved. Patient states that she remained conscious throughout the whole episode.  Per EMS, patient was hyperventilating and had some rigidity to her extremities on their arrival.  End tidal CO2 was low, heart rate was 70s and then went to 130s after the episode ended. On exam currently patient is tachycardic with borderline temp (and feels very warm on exam) but other vital signs are normal, her exam is nonfocal, mental status is normal and other than diaphoresis exam is unremarkable.  Overall suspect most likely underlying cause is infection given patient's tachycardia and temperature; will obtain infection/sepsis workup and give fluids and antipyretics.  Differential for the initial episode includes vasovagal near syncope given the HR change (although patient was conscious throughout), dehydration or other metabolic cause causing electrolyte abnormality and muscle stiffness, or hyperventilation. There is no evidence of seizure given patient was alert throughout and had  no postictal phase.  EKG is unremarkable except for tachycardia.  In addition to infection workup plan for basic labs and IV fluids.    Clinical Course as of Sep 19 2125  Wed Sep 19, 2016  1605 pCO2, Ven: (!) 25 [SS]  2115 Assuming care from Dr. Marisa SeverinSiadecki.  In short, Beverly West is a 51 y.o. female with a chief complaint of seizure-like activity.  Refer to the original H&P  for additional details.  The current plan of care is to reassess a follow up metabolic panel.  If improved and patient still feels better, patient is comfortable with the plan to go home and follow up as an outpatient.   [CF]    Clinical Course User Index [CF] Loleta RoseForbach, Cory, MD [SS] Dionne BucySiadecki, Ki Luckman, MD   ----------------------------------------- 2:46 PM on 09/19/2016 -----------------------------------------  Initial workup reveals significantly elevated lactate, low CO2.  This raises the possibility of sepsis so code sepsis initiated, and will give additional fluids.  the patient is improving and differential includes other causes I will hold empiric antibiotics until we have results of UA and chest x-ray.  elected also raises possibility of seizure although this is still less likely given the patient was awake during the episode. Low CO2 also suggests that the stiffness may have been due to hyperventilation.  Will continue to monitor.    ----------------------------------------- 4:22 PM on 09/19/2016 -----------------------------------------  Patient continues to appear comfortable and states she feels better. Vital signs have improved. The UA is negative. Although lactate >4, given the neg CXR and UA, with the current information available to me, I do not think the patient is in septic shock. The lactate>4, is more likely related to muscle spasm vs less likely seizure.  Will repeat lactate, and if still elevated I will give empiric abx and admit.   Workup also notable for low potassium. We will replete potassium, continue IV fluids, and await repeat lactate.  ----------------------------------------- 5:43 PM on 09/19/2016 -----------------------------------------  Repeat lactate has cleared so could sepsis is canceled.  given negative UA and chest x-ray, and no elevated white blood cell count there is no evidence for significant active infection or sepsis at this time.  Although  patient's episode is still not typical for seizure I will obtain CT head as part of usual first onset seizure workup.  Will replete ca and reassess.   ----------------------------------------- 6:38 PM on 09/19/2016 -----------------------------------------  CT head is negative. Plan for [facility] - [facility]  ----------------------------------------- 8:42 PM on 09/19/2016 -----------------------------------------  Patient states that she feels completely well. She feels OK to go home. I [facility] the [facility] and given that patient's vital signs have completely normalized, she is able to tolerate PO, and workup has otherwise been negative, he suggested to give additional potassium repletion in the ER and discharge home with close follow up, which I agree is reasonable at this time.  Patient also agrees with this plan.  Will give additional IV and PO K, then repeat BMP and if improved d/c home.  If persistent electrolyte disturbance or recurrence of sx then would admit.  ----------------------------------------- 9:28 PM on 09/19/2016 -----------------------------------------  Patient signed out to Dr. York CeriseForbach. Awaiting repeat BMP. If improved plan, for discharge home.  ____________________________________________   FINAL CLINICAL IMPRESSION(S) / ED DIAGNOSES  Final diagnoses:  None      NEW MEDICATIONS STARTED DURING THIS VISIT:  New Prescriptions   No medications on file  Note:  This document was prepared using Dragon voice recognition software and may include unintentional dictation errors.    Dionne Bucy, MD 09/19/16 2129

## 2016-09-19 NOTE — ED Provider Notes (Signed)
Clinical Course as of Sep 21 14  Wed Sep 19, 2016  1605 pCO2, Ven: (!) 25 [SS]  2115 Assuming care from Dr. Marisa SeverinSiadecki.  In short, Beverly West is a 51 y.o. female with a chief complaint of seizure-like activity.  Refer to the original H&P for additional details.  The current plan of care is to reassess a follow up metabolic panel.  If improved and patient still feels better, patient is comfortable with the plan to go home and follow up as an outpatient.   [CF]  2253 The patient's repeat metabolic panel was significantly improved in terms of her creatinine and anion gap.  However, her potassium did not improve.  I discussed with the patient and she feels much better overall.  She pointed out that she had just taken the oral potassium when the repeat lab work was drawn and that the IV potassium had not had a chance to run and yet.  I discussed with her in detail and she does not want to be transferred if it is not necessary, and I do not feel that she requires emergent transfer for potassium of 2.8 that has not yet addressed given the timing of the repeat metabolic panel.  She is comfortable with the plan to go home with potassium supplements and I think that is appropriate as well.  She has gotten 10 mEq by IV and 40 mEq by mouth and Dr. Elspeth ChoSiabecki   [CF]    Clinical Course User Index [CF] Loleta RoseForbach, [place] Pelto, MD [SS] Dionne BucySiadecki, Sebastian, MD   Labs Reviewed  COMPREHENSIVE METABOLIC PANEL - Abnormal; Notable for the following:       Result Value   Potassium 2.8 (*)    Chloride 89 (*)    CO2 15 (*)    Glucose, Bld 167 (*)    Creatinine, Ser 1.64 (*)    Total Protein 8.9 (*)    AST 102 (*)    GFR calc non Af Amer 35 (*)    GFR calc Af Amer 41 (*)    Anion gap 31 (*)    All other components within normal limits  CBC WITH DIFFERENTIAL/PLATELET - Abnormal; Notable for the following:    Lymphs Abs 5.4 (*)    Monocytes Absolute 1.1 (*)    All other components within normal limits   URINALYSIS, ROUTINE W REFLEX MICROSCOPIC - Abnormal; Notable for the following:    Color, Urine YELLOW (*)    APPearance CLEAR (*)    Hgb urine dipstick MODERATE (*)    Ketones, ur 5 (*)    Protein, ur 100 (*)    Squamous Epithelial / LPF 0-5 (*)    All other components within normal limits  LACTIC ACID, PLASMA - Abnormal; Notable for the following:    Lactic Acid, Venous 14.5 (*)    All other components within normal limits  LACTIC ACID, PLASMA - Abnormal; Notable for the following:    Lactic Acid, Venous 2.1 (*)    All other components within normal limits  BLOOD GAS, VENOUS - Abnormal; Notable for the following:    pH, Ven 7.47 (*)    pCO2, Ven 25 (*)    pO2, Ven 80.0 (*)    Bicarbonate 18.2 (*)    Acid-base deficit 3.7 (*)    All other components within normal limits  BASIC METABOLIC PANEL - Abnormal; Notable for the following:    Sodium 134 (*)    Potassium 2.8 (*)    Chloride 98 (*)  Calcium 7.6 (*)    All other components within normal limits  CULTURE, BLOOD (ROUTINE X 2)  CULTURE, BLOOD (ROUTINE X 2)  TROPONIN I     Final diagnoses:  Hypokalemia  Muscle spasm  Shortness of breath      Loleta Rose, MD 09/20/16 [phone]

## 2016-09-19 NOTE — ED Notes (Signed)
Canceled Code Sepsis to [facility] in [facility] per Dr. Marisa SeverinSiadecki   1743

## 2016-09-24 LAB — CULTURE, BLOOD (ROUTINE X 2)
Culture: NO GROWTH
Culture: NO GROWTH
SPECIAL REQUESTS: ADEQUATE
Special Requests: ADEQUATE

## 2016-12-07 ENCOUNTER — Encounter: Payer: Self-pay | Admitting: Emergency Medicine

## 2016-12-07 ENCOUNTER — Other Ambulatory Visit: Payer: Self-pay

## 2016-12-07 ENCOUNTER — Emergency Department
Admission: EM | Admit: 2016-12-07 | Discharge: 2016-12-07 | Disposition: A | Discharge status: Home / Self Care | Payer: Non-veteran care | Attending: Emergency Medicine | Admitting: Emergency Medicine

## 2016-12-07 DIAGNOSIS — I10 Essential (primary) hypertension: Secondary | ICD-10-CM | POA: Insufficient documentation

## 2016-12-07 DIAGNOSIS — F17200 Nicotine dependence, unspecified, uncomplicated: Secondary | ICD-10-CM | POA: Diagnosis not present

## 2016-12-07 DIAGNOSIS — R200 Anesthesia of skin: Secondary | ICD-10-CM | POA: Diagnosis present

## 2016-12-07 DIAGNOSIS — Z79899 Other long term (current) drug therapy: Secondary | ICD-10-CM | POA: Diagnosis not present

## 2016-12-07 DIAGNOSIS — G252 Other specified forms of tremor: Secondary | ICD-10-CM | POA: Diagnosis not present

## 2016-12-07 DIAGNOSIS — R251 Tremor, unspecified: Secondary | ICD-10-CM

## 2016-12-07 LAB — BASIC METABOLIC PANEL
Anion gap: 16 — ABNORMAL HIGH (ref 5–15)
BUN: 16 mg/dL (ref 6–20)
CHLORIDE: 106 mmol/L (ref 101–111)
CO2: 17 mmol/L — AB (ref 22–32)
CREATININE: 0.85 mg/dL (ref 0.44–1.00)
Calcium: 9.6 mg/dL (ref 8.9–10.3)
GFR calc Af Amer: >60 mL/min (ref >60)
GFR calc non Af Amer: >60 mL/min (ref >60)
Glucose, Bld: 196 mg/dL — ABNORMAL HIGH (ref 65–99)
POTASSIUM: 3.4 mmol/L — AB (ref 3.5–5.1)
SODIUM: 139 mmol/L (ref 135–145)

## 2016-12-07 LAB — CBC
HEMATOCRIT: 35.7 % (ref 35.0–47.0)
Hemoglobin: 12.3 g/dL (ref 12.0–16.0)
MCH: 30.5 pg (ref 26.0–34.0)
MCHC: 34.3 g/dL (ref 32.0–36.0)
MCV: 88.9 fL (ref 80.0–100.0)
Platelets: 254 10*3/uL (ref 150–440)
RBC: 4.02 MIL/uL (ref 3.80–5.20)
RDW: 15.2 % — AB (ref 11.5–14.5)
WBC: 10.2 10*3/uL (ref 3.6–11.0)

## 2016-12-07 LAB — TROPONIN I: Troponin I: <0.03 ng/mL (ref <0.03)

## 2016-12-07 MED ORDER — POTASSIUM CHLORIDE CRYS ER 20 MEQ PO TBCR: 80.0000 meq | EXTENDED_RELEASE_TABLET | Freq: Once | ORAL | Status: DC

## 2016-12-07 MED ORDER — MAGNESIUM GLUCONATE 500 MG PO TABS
500.0000 mg | ORAL_TABLET | Freq: Once | ORAL | Status: DC
  Filled 2016-12-07: qty 1

## 2016-12-07 NOTE — ED Notes (Signed)
FN note: Pt arrived via EMS from home for reports of possible decreased potassium. Pt reports has history of same and feels like she did then. EMS reports 12 lead WNL. Pt presents with nausea and vomiting. EMS reports VSS, 154/92.

## 2016-12-07 NOTE — ED Provider Notes (Signed)
[facility] Emergency Department Provider Note  ____________________________________________   First MD Initiated Contact with Patient 12/07/16 1543     (approximate)  I have reviewed the triage vital signs and the nursing notes.   HISTORY  Chief Complaint hypokalemia   HPI Beverly West is a 51 y.o. female who comes to the emergency department by EMS requesting blood work done because she thinks her potassium might be low.  Gradually throughout the day she has had progressive onset of tingling in bilateral hands.  She said she was seen in our emergency department recently for the same and her potassium was 2.8.  She reports compliance with potassium medications at home and her symptoms had not recurred until today.  She denies chest pain shortness of breath abdominal pain nausea or vomiting.  She is not taking magnesium.  Her symptoms had gradual onset and have been constant.  Nothing seems to make them better or worse.  Past Medical History:  Diagnosis Date  . Carpal tunnel syndrome   . Hypertension     There are no active problems to display for this patient.   Past Surgical History:  Procedure Laterality Date  . CHOLECYSTECTOMY    . TUBAL LIGATION      Prior to Admission medications   Medication Sig Start Date End Date Taking? Authorizing Provider  amLODipine (NORVASC) 10 MG tablet Take 10 mg by mouth daily.    [provider]  fluticasone (VERAMYST) 27.5 MCG/SPRAY nasal spray Place 2 sprays into the nose daily.    [provider]  gabapentin (NEURONTIN) 300 MG capsule Take 300 mg by mouth at bedtime.    [provider]  ketotifen (ZADITOR) 0.025 % ophthalmic solution Place 1 drop into both eyes 2 (two) times daily.    [provider]  loratadine (CLARITIN) 10 MG tablet Take 10 mg by mouth daily as needed.     [provider]  montelukast (SINGULAIR) 10 MG tablet Take 10 mg by mouth at  bedtime.    [provider]  naproxen sodium (ANAPROX) 220 MG tablet Take 220 mg by mouth 2 (two) times daily as needed.     [provider]  omeprazole (PRILOSEC) 20 MG capsule Take 20 mg by mouth 2 (two) times daily before a meal.    [provider]  potassium chloride 20 MEQ TBCR Take 20 mEq by mouth 2 (two) times daily. 09/20/16 10/05/16  Dionne BucySiadecki, Sebastian, MD    Allergies Penicillins and Tetracyclines & related  No family history on file.  Social History Social History   Tobacco Use  . Smoking status: Current Every Day Smoker  . Smokeless tobacco: Never Used  Substance Use Topics  . Alcohol use: Yes    Comment: rarely  . Drug use: No    Review of Systems Constitutional: No fever/chills Eyes: No visual changes. ENT: No sore throat. Cardiovascular: Denies chest pain. Respiratory: Denies shortness of breath. Gastrointestinal: No abdominal pain.  No nausea, no vomiting.  No diarrhea.  No constipation. Genitourinary: Negative for dysuria. Musculoskeletal: Negative for back pain. Skin: Negative for rash. Neurological: Positive for tingling in hands   ____________________________________________   PHYSICAL EXAM:  VITAL SIGNS: ED Triage Vitals  Enc Vitals Group     BP 12/07/16 1318 (!) 152/93     Pulse Rate 12/07/16 1318 (!) 127     Resp 12/07/16 1318 (!) 22     Temp 12/07/16 1318 99 F (37.2 C)  Temp Source 12/07/16 1318 Oral     SpO2 12/07/16 1318 100 %     Weight 12/07/16 1319 170 lb (77.1 kg)     Height 12/07/16 1319 5\' 5"  (1.651 m)     Head Circumference --      Peak Flow --      Pain Score --      Pain Loc --      Pain Edu? --      Excl. in GC? --     Constitutional: Alert and oriented x4 somewhat anxious appearing but nontoxic no diaphoresis speaks in full clear sentences Eyes: PERRL EOMI. Head: Atraumatic. Nose: No congestion/rhinnorhea. Mouth/Throat: No trismus Neck: No stridor.   Cardiovascular: Normal rate,  regular rhythm. Grossly normal heart sounds.  Good peripheral circulation. Respiratory: Normal respiratory effort.  No retractions. Lungs CTAB and moving good air Gastrointestinal: Soft nontender Musculoskeletal: No lower extremity edema   Neurologic:  Normal speech and language. No gross focal neurologic deficits are appreciated. Skin:  Skin is warm, dry and intact. No rash noted. Psychiatric: Mood and affect are normal. Speech and behavior are normal.    ____________________________________________   DIFFERENTIAL includes but not limited to  Hypokalemia, hypomagnesemia, panic attack, hyperventilation, hypocalcemia, dehydration, hyperthyroidism ____________________________________________   LABS (all labs ordered are listed, but only abnormal results are displayed)  Labs Reviewed  BASIC METABOLIC PANEL - Abnormal; Notable for the following components:      Result Value   Potassium 3.4 (*)    CO2 17 (*)    Glucose, Bld 196 (*)    Anion gap 16 (*)    All other components within normal limits  CBC - Abnormal; Notable for the following components:   RDW 15.2 (*)    All other components within normal limits  TROPONIN I    Blood work reviewed by me shows clinically insignificant hypokalemia __________________________________________  EKG  ED ECG REPORT I, Merrily BrittleNeil Emmalina Espericueta, the attending physician, personally viewed and interpreted this ECG.  Date: 12/07/2016 EKG Time:  Rate: 120 Rhythm: Sinus tachycardia QRS Axis: normal Intervals: normal ST/T Wave abnormalities: normal Narrative Interpretation: no evidence of acute ischemia  ____________________________________________  RADIOLOGY   ____________________________________________   PROCEDURES  Procedure(s) performed: no  Procedures  Critical Care performed: no  Observation: no ____________________________________________   INITIAL IMPRESSION / ASSESSMENT AND PLAN / ED COURSE  Pertinent labs & imaging  results that were available during my care of the patient were reviewed by me and considered in my medical decision making (see chart for details).       ----------------------------------------- 3:58 PM on 12/07/2016 -----------------------------------------  The patient's heart rate is normalized and her exam is unremarkable.  Her potassium is 3.4 here today.  I had a lengthy discussion regarding hypomagnesemia making potassium repletion difficult.  I offered her potassium and magnesium supplementation now however she declined stating she would prefer to just go home.  She is able to follow-up with her primary care physician this coming week for reevaluation and.  Strict return precautions have been given. ____________________________________________   FINAL CLINICAL IMPRESSION(S) / ED DIAGNOSES  Final diagnoses:  Tremors of nervous system      NEW MEDICATIONS STARTED DURING THIS VISIT:  This SmartLink is deprecated. Use AVSMEDLIST instead to display the medication list for a patient.   Note:  This document was prepared using Dragon voice recognition software and may include unintentional dictation errors.     Merrily Brittleifenbark, Loras Grieshop, MD 12/08/16 1725

## 2016-12-07 NOTE — Discharge Instructions (Signed)
You will never be able to get your potassium up unless you also take magnesium supplementation.  Please make sure you remain well-hydrated and follow-up with your primary care physician this coming week for reevaluation.  Return to the emergency department for any concerns.  It was a pleasure to take care of you today, and thank you for coming to our emergency department.  If you have any questions or concerns before leaving please ask the nurse to grab me and I'm more than happy to go through your aftercare instructions again.  If you were prescribed any opioid pain medication today such as Norco, Vicodin, Percocet, morphine, hydrocodone, or oxycodone please make sure you do not drive when you are taking this medication as it can alter your ability to drive safely.  If you have any concerns once you are home that you are not improving or are in fact getting worse before you can make it to your follow-up appointment, please do not hesitate to call 911 and come back for further evaluation.  Merrily BrittleNeil Cace Osorto, MD  Results for orders placed or performed during the hospital encounter of 12/07/16  Basic metabolic panel  Result Value Ref Range   Sodium 139 135 - 145 mmol/L   Potassium 3.4 (L) 3.5 - 5.1 mmol/L   Chloride 106 101 - 111 mmol/L   CO2 17 (L) 22 - 32 mmol/L   Glucose, Bld 196 (H) 65 - 99 mg/dL   BUN 16 6 - 20 mg/dL   Creatinine, Ser 0.450.85 0.44 - 1.00 mg/dL   Calcium 9.6 8.9 - 40.910.3 mg/dL   GFR calc non Af Amer >60 >60 mL/min   GFR calc Af Amer >60 >60 mL/min   Anion gap 16 (H) 5 - 15  CBC  Result Value Ref Range   WBC 10.2 3.6 - 11.0 K/uL   RBC 4.02 3.80 - 5.20 MIL/uL   Hemoglobin 12.3 12.0 - 16.0 g/dL   HCT 81.135.7 91.435.0 - 78.247.0 %   MCV 88.9 80.0 - 100.0 fL   MCH 30.5 26.0 - 34.0 pg   MCHC 34.3 32.0 - 36.0 g/dL   RDW 95.615.2 (H) 21.311.5 - 08.614.5 %   Platelets 254 150 - 440 K/uL  Troponin I  Result Value Ref Range   Troponin I <0.03 <0.03 ng/mL

## 2016-12-07 NOTE — ED Triage Notes (Signed)
[facility].  Patient states she has had low potassium in the past and when she feels her "fingers locking up" .

## 2019-03-22 ENCOUNTER — Emergency Department
Admission: EM | Admit: 2019-03-22 | Discharge: 2019-03-22 | Disposition: A | Discharge status: Home / Self Care | Payer: No Typology Code available for payment source | Attending: Emergency Medicine | Admitting: Emergency Medicine

## 2019-03-22 ENCOUNTER — Other Ambulatory Visit: Payer: Self-pay

## 2019-03-22 DIAGNOSIS — F17210 Nicotine dependence, cigarettes, uncomplicated: Secondary | ICD-10-CM | POA: Diagnosis not present

## 2019-03-22 DIAGNOSIS — E876 Hypokalemia: Secondary | ICD-10-CM | POA: Diagnosis not present

## 2019-03-22 DIAGNOSIS — R42 Dizziness and giddiness: Secondary | ICD-10-CM | POA: Insufficient documentation

## 2019-03-22 DIAGNOSIS — I10 Essential (primary) hypertension: Secondary | ICD-10-CM | POA: Insufficient documentation

## 2019-03-22 DIAGNOSIS — Z79899 Other long term (current) drug therapy: Secondary | ICD-10-CM | POA: Diagnosis not present

## 2019-03-22 DIAGNOSIS — E860 Dehydration: Secondary | ICD-10-CM | POA: Diagnosis not present

## 2019-03-22 LAB — URINALYSIS, COMPLETE (UACMP) WITH MICROSCOPIC
Bilirubin Urine: NEGATIVE
Glucose, UA: NEGATIVE mg/dL
Hgb urine dipstick: NEGATIVE
Ketones, ur: NEGATIVE mg/dL
Leukocytes,Ua: NEGATIVE
Nitrite: NEGATIVE
Protein, ur: NEGATIVE mg/dL
Specific Gravity, Urine: 1.002 — ABNORMAL LOW (ref 1.005–1.030)
pH: 7 (ref 5.0–8.0)

## 2019-03-22 LAB — CBC
HCT: 35 % — ABNORMAL LOW (ref 36.0–46.0)
Hemoglobin: 12.3 g/dL (ref 12.0–15.0)
MCH: 29.8 pg (ref 26.0–34.0)
MCHC: 35.1 g/dL (ref 30.0–36.0)
MCV: 84.7 fL (ref 80.0–100.0)
Platelets: 225 10*3/uL (ref 150–400)
RBC: 4.13 MIL/uL (ref 3.87–5.11)
RDW: 12.7 % (ref 11.5–15.5)
WBC: 8.2 10*3/uL (ref 4.0–10.5)
nRBC: 0 % (ref 0.0–0.2)

## 2019-03-22 LAB — BASIC METABOLIC PANEL
Anion gap: 12 (ref 5–15)
BUN: 13 mg/dL (ref 6–20)
CO2: 27 mmol/L (ref 22–32)
Calcium: 9.6 mg/dL (ref 8.9–10.3)
Chloride: 95 mmol/L — ABNORMAL LOW (ref 98–111)
Creatinine, Ser: 0.82 mg/dL (ref 0.44–1.00)
GFR calc Af Amer: >60 mL/min (ref >60)
GFR calc non Af Amer: >60 mL/min (ref >60)
Glucose, Bld: 107 mg/dL — ABNORMAL HIGH (ref 70–99)
Potassium: 2.7 mmol/L — CL (ref 3.5–5.1)
Sodium: 134 mmol/L — ABNORMAL LOW (ref 135–145)

## 2019-03-22 LAB — MAGNESIUM: Magnesium: 1.6 mg/dL — ABNORMAL LOW (ref 1.7–2.4)

## 2019-03-22 MED ORDER — MAGNESIUM SULFATE 2 GM/50ML IV SOLN
2.0000 g | Freq: Once | INTRAVENOUS | Status: AC
  Administered 2019-03-22: 2 g via INTRAVENOUS
  Filled 2019-03-22: qty 50

## 2019-03-22 MED ORDER — SODIUM CHLORIDE 0.9% FLUSH: 3.0000 mL | Freq: Once | INTRAVENOUS | Status: DC

## 2019-03-22 MED ORDER — POTASSIUM CHLORIDE CRYS ER 20 MEQ PO TBCR
40.0000 meq | EXTENDED_RELEASE_TABLET | Freq: Once | ORAL | Status: AC
  Administered 2019-03-22: 40 meq via ORAL
  Filled 2019-03-22: qty 2

## 2019-03-22 MED ORDER — SODIUM CHLORIDE 0.9 % IV BOLUS
1000.0000 mL | Freq: Once | INTRAVENOUS | Status: AC
  Administered 2019-03-22: 20:00:00 1000 mL via INTRAVENOUS

## 2019-03-22 NOTE — ED Provider Notes (Signed)
[facility] Emergency Department Provider Note   ____________________________________________   I have reviewed the triage vital signs and the nursing notes.   HISTORY  Chief Complaint Dizziness   History limited by: Not Limited   HPI Beverly West is a 54 y.o. female who presents to the emergency department today because of concern for dizziness. The patient states that she has not felt quite right over the past couple of days. She has had intermittent episodes of feeling dizzy and off balance. Today however the symptoms were more pronounced. She noticed the dizziness more when she stood up and it would be improved when she sat down. The patient denies any chest pain or feelings of palpitations with the dizziness. Has a known history of low potassium and magnesium levels and takes supplements at home. Daughter was recently diagnosed with the flu. Patient denies any fevers.   Records reviewed. Per medical record review patient has a history of hypertension.  Past Medical History:  Diagnosis Date  . Carpal tunnel syndrome   . Hypertension     There are no problems to display for this patient.   Past Surgical History:  Procedure Laterality Date  . CHOLECYSTECTOMY    . TUBAL LIGATION      Prior to Admission medications   Medication Sig Start Date End Date Taking? Authorizing Provider  amLODipine (NORVASC) 10 MG tablet Take 10 mg by mouth daily.    [provider]  fluticasone (VERAMYST) 27.5 MCG/SPRAY nasal spray Place 2 sprays into the nose daily.    [provider]  gabapentin (NEURONTIN) 300 MG capsule Take 300 mg by mouth at bedtime.    [provider]  ketotifen (ZADITOR) 0.025 % ophthalmic solution Place 1 drop into both eyes 2 (two) times daily.    [provider]  loratadine (CLARITIN) 10 MG tablet Take 10 mg by mouth daily as needed.     [provider]  montelukast (SINGULAIR) 10 MG tablet  Take 10 mg by mouth at bedtime.    [provider]  naproxen sodium (ANAPROX) 220 MG tablet Take 220 mg by mouth 2 (two) times daily as needed.     [provider]  omeprazole (PRILOSEC) 20 MG capsule Take 20 mg by mouth 2 (two) times daily before a meal.    [provider]  potassium chloride 20 MEQ TBCR Take 20 mEq by mouth 2 (two) times daily. 09/20/16 10/05/16  Arta Silence, MD    Allergies Penicillins and Tetracyclines & related  No family history on file.  Social History Social History   Tobacco Use  . Smoking status: Current Every Day Smoker  . Smokeless tobacco: Never Used  Substance Use Topics  . Alcohol use: Yes    Comment: rarely  . Drug use: No    Review of Systems Constitutional: No fever/chills Eyes: No visual changes. ENT: No sore throat. Cardiovascular: Denies chest pain. Respiratory: Denies shortness of breath. Gastrointestinal: No abdominal pain.  No nausea, no vomiting.  No diarrhea.   Genitourinary: Negative for dysuria. Musculoskeletal: Negative for back pain. Skin: Negative for rash. Neurological: Positive for dizziness.  ____________________________________________   PHYSICAL EXAM:  VITAL SIGNS: ED Triage Vitals  Enc Vitals Group     BP 03/22/19 1657 127/83     Pulse Rate 03/22/19 1657 96     Resp 03/22/19 1657 16     Temp 03/22/19 1657 99.1 F (37.3 C)     Temp Source 03/22/19 1657 Oral  SpO2 03/22/19 1657 100 %     Weight 03/22/19 1657 160 lb (72.6 kg)     Height 03/22/19 1657 5\' 5"  (1.651 m)     Head Circumference --      Peak Flow --      Pain Score 03/22/19 1702 0   Constitutional: Alert and oriented.  Eyes: Conjunctivae are normal.  ENT      Head: Normocephalic and atraumatic.      Nose: No congestion/rhinnorhea.      Mouth/Throat: Mucous membranes are moist.      Neck: No stridor. Hematological/Lymphatic/Immunilogical: No cervical lymphadenopathy. Cardiovascular: Normal rate, regular rhythm.   No murmurs, rubs, or gallops.  Respiratory: Normal respiratory effort without tachypnea nor retractions. Breath sounds are clear and equal bilaterally. No wheezes/rales/rhonchi. Gastrointestinal: Soft and non tender. No rebound. No guarding.  Genitourinary: Deferred Musculoskeletal: Normal range of motion in all extremities. No lower extremity edema. Neurologic:  Normal speech and language. No gross focal neurologic deficits are appreciated.  Skin:  Skin is warm, dry and intact. No rash noted. Psychiatric: Mood and affect are normal. Speech and behavior are normal. Patient exhibits appropriate insight and judgment.  ____________________________________________    LABS (pertinent positives/negatives)  CBC wbc 8.2, hgb 12.3, plt 225 BMP na 134, k 2.7, glu 107, cr 0.82 UA clear, 0-5 rbc and wbc ____________________________________________   EKG  I, 05/22/19, attending physician, personally viewed and interpreted this EKG  EKG Time: 1705 Rate: 94 Rhythm: normal sinus rhythm Axis: left axis deviation Intervals: qtc 437 QRS: narrow, q waves V1, V2 ST changes: no st elevation Impression: abnormal ekg   ____________________________________________    RADIOLOGY  None  ____________________________________________   PROCEDURES  Procedures  ____________________________________________   INITIAL IMPRESSION / ASSESSMENT AND PLAN / ED COURSE  Pertinent labs & imaging results that were available during my care of the patient were reviewed by me and considered in my medical decision making (see chart for details).   Patient presented to the emergency department today because of concern for dizziness.  Blood work was notable for hypokalemia and hypomagnesium Mia.  Patient states she has a history of both of these.  Patient was given IV fluids as well as potassium and magnesium here with good resolution of her symptoms.  I discussed with patient importance of following up  with her primary care to recheck her blood levels in the next week.  ___________________________________________   FINAL CLINICAL IMPRESSION(S) / ED DIAGNOSES  Final diagnoses:  Dizziness  Dehydration  Hypokalemia  Hypomagnesemia     Note: This dictation was prepared with Dragon dictation. Any transcriptional errors that result from this process are unintentional     Phineas Semen, MD 03/22/19 2311

## 2019-03-22 NOTE — Discharge Instructions (Addendum)
Please talk to your doctor about getting repeat blood work done next week to check your potassium and magnessium levels. Please seek medical attention for any high fevers, chest pain, shortness of breath, change in behavior, persistent vomiting, bloody stool or any other new or concerning symptoms.

## 2019-03-22 NOTE — ED Triage Notes (Signed)
Pt to the er via ems for BP being low. Pt says her systolic was 112 which is low for her. She runs 123 systolic. Pt reports being light headed. Pt says she was exposed to the flu. EMS BP was 138/89.

## 2019-03-22 NOTE — ED Triage Notes (Signed)
Pt presents to ED via [facility] with c/o feeling dizzy x several days. Pt states took BP on home BP machine and was getting readings of 111 systolic and 117 systolic, pt states is very low for her. Pt presents with 20g to R AC. NAD noted, A&O x4 upon arrival to triage room.

## 2019-03-22 NOTE — ED Notes (Signed)
Pt states unable to void at this time. This RN asked pt to let us know when she is able to void and pt verbalized understanding and consent. Pt resting in room at this time and MD Derrill Kay leaving bedside.

## 2019-03-22 NOTE — ED Notes (Signed)
Date and time results received: 03/22/19 5:37 PM  (use smartphrase ".now" to insert current time)  Test: K Critical Value: 2.7  Name of Provider Notified: Derrill Kay  Orders Received? Or Actions Taken?: Critical results acknoweldged

## 2019-04-06 IMAGING — CR DG CHEST 2V
1 series · 2 of 2 positions shown · non-contrast
Comparison: None.

CLINICAL DATA: Diaphoresis.

EXAM:
CHEST  2 VIEW

[Series 1: dg chest 2 view · 0.14mm/px · 2 of 2 slices shown]
[im 1/2]
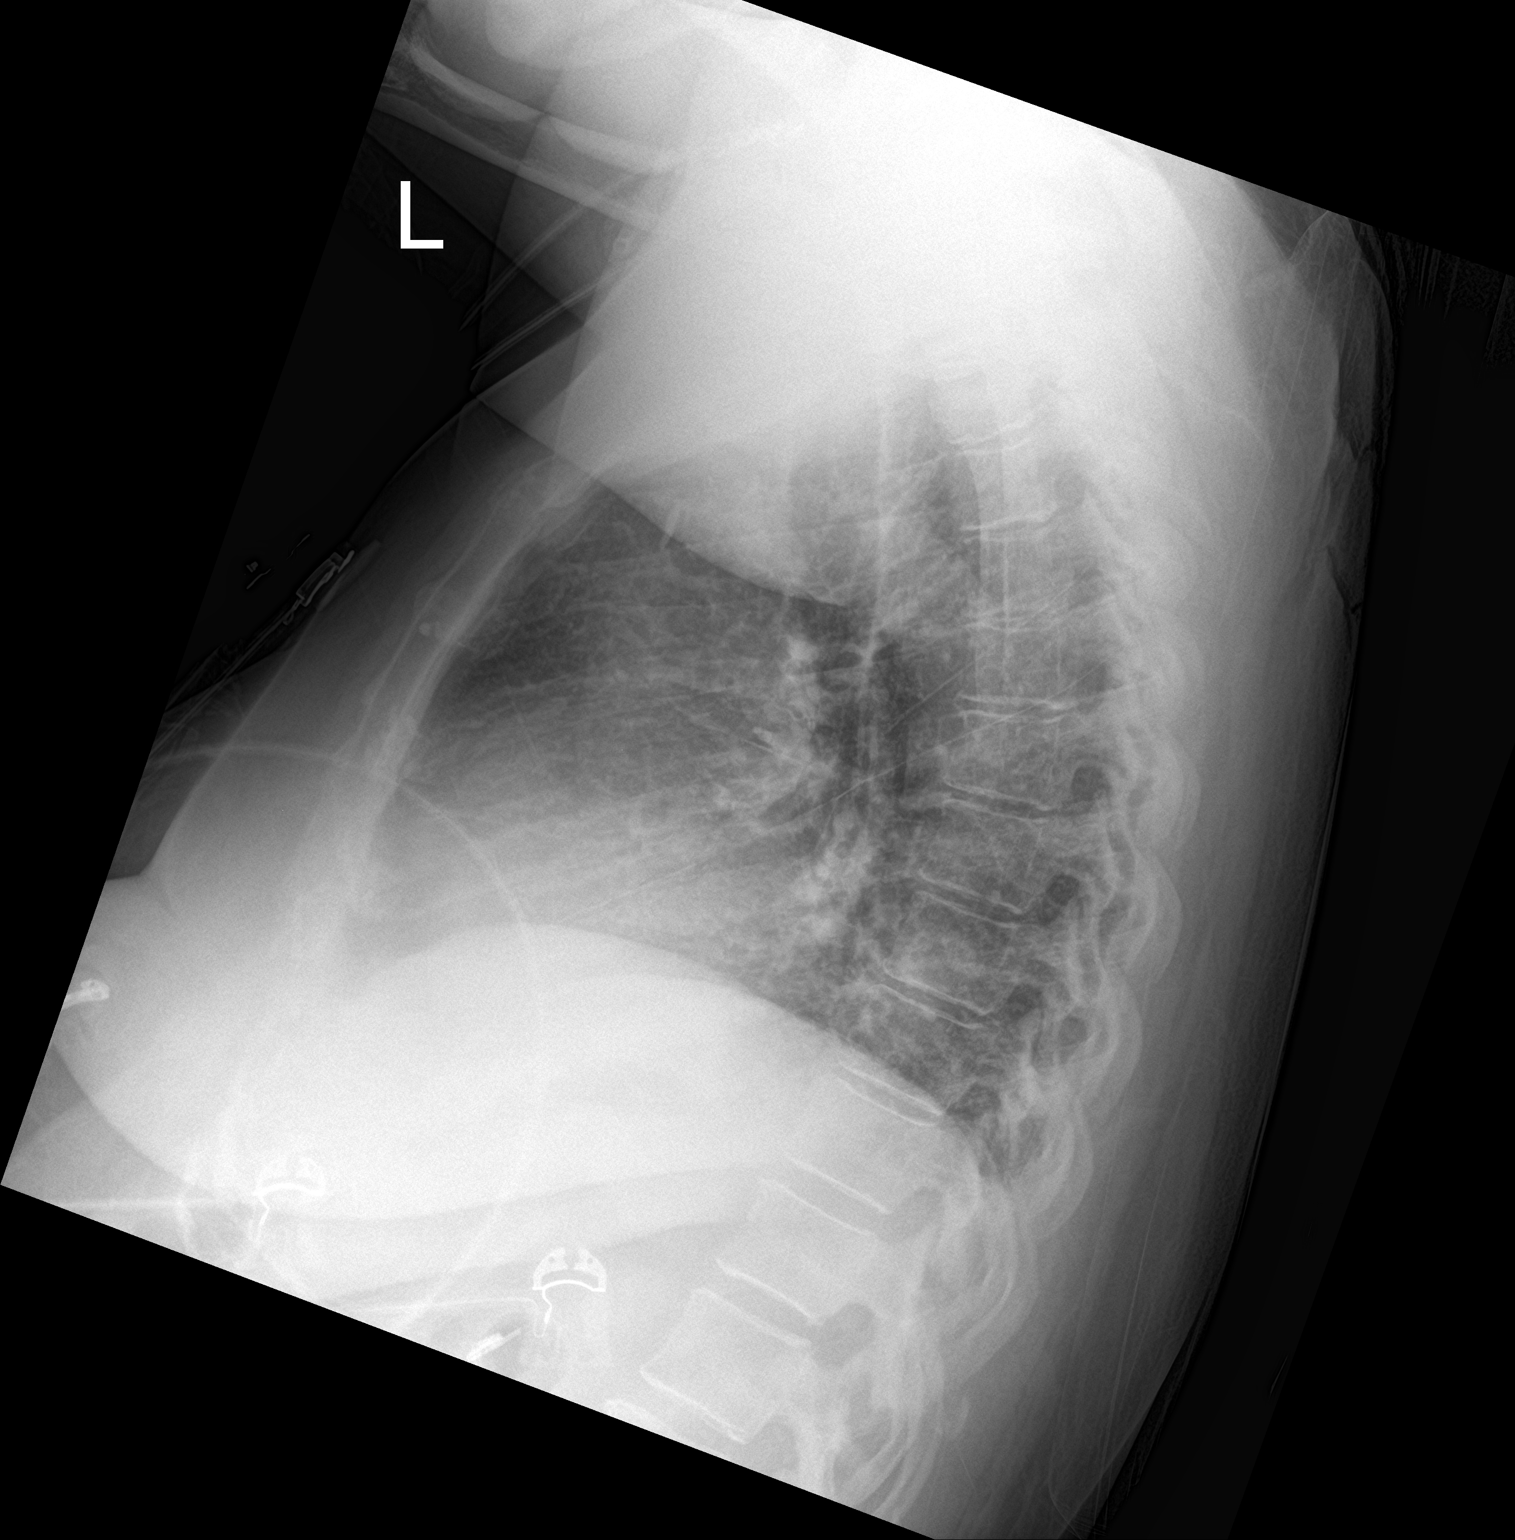
[im 2/2]
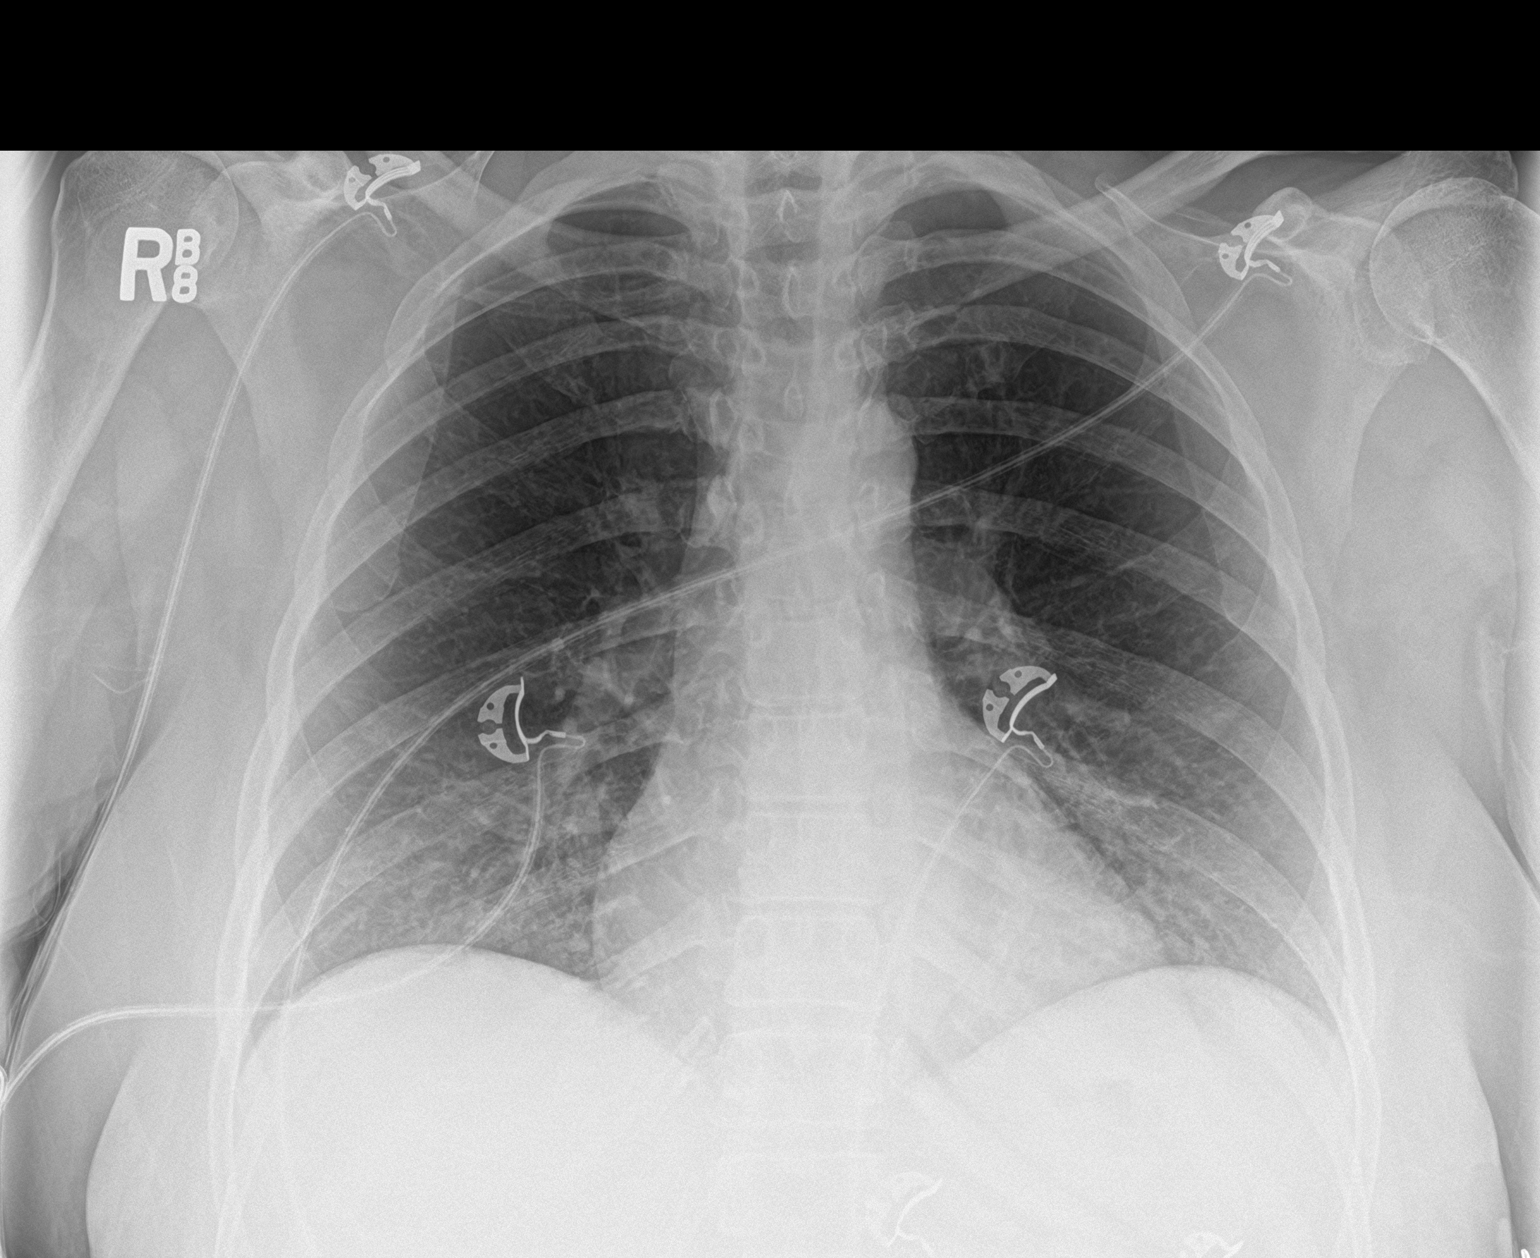

[2 of 2 positions shown; findings below may reference images not displayed]

FINDINGS: The lungs are clear without focal pneumonia, edema, pneumothorax or
pleural effusion. The cardiopericardial silhouette is within normal
limits for size. The visualized bony structures of the thorax are
intact. Telemetry leads overlie the chest.
IMPRESSION: No active cardiopulmonary disease.

## 2020-11-18 ENCOUNTER — Other Ambulatory Visit: Payer: Self-pay

## 2020-11-18 ENCOUNTER — Emergency Department
Admission: EM | Admit: 2020-11-18 | Discharge: 2020-11-18 | Disposition: A | Discharge status: Home / Self Care | Payer: No Typology Code available for payment source | Attending: Physician Assistant | Admitting: Physician Assistant

## 2020-11-18 ENCOUNTER — Emergency Department: Payer: No Typology Code available for payment source

## 2020-11-18 DIAGNOSIS — S92352A Displaced fracture of fifth metatarsal bone, left foot, initial encounter for closed fracture: Secondary | ICD-10-CM | POA: Insufficient documentation

## 2020-11-18 DIAGNOSIS — R52 Pain, unspecified: Secondary | ICD-10-CM

## 2020-11-18 DIAGNOSIS — I10 Essential (primary) hypertension: Secondary | ICD-10-CM | POA: Insufficient documentation

## 2020-11-18 DIAGNOSIS — Z79899 Other long term (current) drug therapy: Secondary | ICD-10-CM | POA: Insufficient documentation

## 2020-11-18 DIAGNOSIS — Z20822 Contact with and (suspected) exposure to covid-19: Secondary | ICD-10-CM | POA: Insufficient documentation

## 2020-11-18 DIAGNOSIS — X58XXXA Exposure to other specified factors, initial encounter: Secondary | ICD-10-CM | POA: Insufficient documentation

## 2020-11-18 DIAGNOSIS — F17200 Nicotine dependence, unspecified, uncomplicated: Secondary | ICD-10-CM | POA: Insufficient documentation

## 2020-11-18 DIAGNOSIS — B349 Viral infection, unspecified: Secondary | ICD-10-CM | POA: Insufficient documentation

## 2020-11-18 LAB — CBC WITH DIFFERENTIAL/PLATELET
Abs Immature Granulocytes: 0.03 10*3/uL (ref 0.00–0.07)
Basophils Absolute: 0 10*3/uL (ref 0.0–0.1)
Basophils Relative: 0 %
Eosinophils Absolute: 0.1 10*3/uL (ref 0.0–0.5)
Eosinophils Relative: 1 %
HCT: 33.4 % — ABNORMAL LOW (ref 36.0–46.0)
Hemoglobin: 11.3 g/dL — ABNORMAL LOW (ref 12.0–15.0)
Immature Granulocytes: 0 %
Lymphocytes Relative: 47 %
Lymphs Abs: 4.2 10*3/uL — ABNORMAL HIGH (ref 0.7–4.0)
MCH: 30.3 pg (ref 26.0–34.0)
MCHC: 33.8 g/dL (ref 30.0–36.0)
MCV: 89.5 fL (ref 80.0–100.0)
Monocytes Absolute: 0.8 10*3/uL (ref 0.1–1.0)
Monocytes Relative: 8 %
Neutro Abs: 3.9 10*3/uL (ref 1.7–7.7)
Neutrophils Relative %: 44 %
Platelets: 173 10*3/uL (ref 150–400)
RBC: 3.73 MIL/uL — ABNORMAL LOW (ref 3.87–5.11)
RDW: 14.3 % (ref 11.5–15.5)
WBC: 9 10*3/uL (ref 4.0–10.5)
nRBC: 0 % (ref 0.0–0.2)

## 2020-11-18 LAB — BASIC METABOLIC PANEL
Anion gap: 10 (ref 5–15)
BUN: 22 mg/dL — ABNORMAL HIGH (ref 6–20)
CO2: 31 mmol/L (ref 22–32)
Calcium: 9.4 mg/dL (ref 8.9–10.3)
Chloride: 98 mmol/L (ref 98–111)
Creatinine, Ser: 0.85 mg/dL (ref 0.44–1.00)
GFR, Estimated: >60 mL/min (ref >60)
Glucose, Bld: 78 mg/dL (ref 70–99)
Potassium: 3.2 mmol/L — ABNORMAL LOW (ref 3.5–5.1)
Sodium: 139 mmol/L (ref 135–145)

## 2020-11-18 LAB — RESP PANEL BY RT-PCR (FLU A&B, COVID) ARPGX2
Influenza A by PCR: NEGATIVE
Influenza B by PCR: NEGATIVE
SARS Coronavirus 2 by RT PCR: NEGATIVE

## 2020-11-18 NOTE — ED Provider Notes (Signed)
[facility] Emergency Department Provider Note ____________________________________________  Time seen: 1517  I have reviewed the triage vital signs and the nursing notes.  HISTORY  Chief Complaint  Generalized Body Aches   HPI Beverly West is a 55 y.o. female presents to the ED for evaluation of generalized body aches as well as what she describes difficult swallowing.  She denies any pain or sore throat.  She also denies any chest pain or shortness of breath.  She also reports an injury to the left ankle and foot, resulting in lateral foot swelling.  Past Medical History:  Diagnosis Date   Carpal tunnel syndrome    Hypertension     There are no problems to display for this patient.   Past Surgical History:  Procedure Laterality Date   CHOLECYSTECTOMY     TUBAL LIGATION      Prior to Admission medications   Medication Sig Start Date End Date Taking? Authorizing Provider  amLODipine (NORVASC) 10 MG tablet Take 10 mg by mouth daily.    [provider]  fluticasone (VERAMYST) 27.5 MCG/SPRAY nasal spray Place 2 sprays into the nose daily.    [provider]  gabapentin (NEURONTIN) 300 MG capsule Take 300 mg by mouth at bedtime.    [provider]  ketotifen (ZADITOR) 0.025 % ophthalmic solution Place 1 drop into both eyes 2 (two) times daily.    [provider]  loratadine (CLARITIN) 10 MG tablet Take 10 mg by mouth daily as needed.     [provider]  montelukast (SINGULAIR) 10 MG tablet Take 10 mg by mouth at bedtime.    [provider]  naproxen sodium (ANAPROX) 220 MG tablet Take 220 mg by mouth 2 (two) times daily as needed.     [provider]  omeprazole (PRILOSEC) 20 MG capsule Take 20 mg by mouth 2 (two) times daily before a meal.    [provider]  potassium chloride 20 MEQ TBCR Take 20 mEq by mouth 2 (two) times daily. 09/20/16 10/05/16  Dionne Bucy, MD     Allergies Penicillins and Tetracyclines & related  History reviewed. No pertinent family history.  Social History Social History   Tobacco Use   Smoking status: Every Day   Smokeless tobacco: Never  Substance Use Topics   Alcohol use: Yes    Comment: rarely   Drug use: No    Review of Systems  Constitutional: Negative for fever.  Generalized body aches Eyes: Negative for visual changes. ENT: Negative for sore throat. Cardiovascular: Negative for chest pain. Respiratory: Negative for shortness of breath. Gastrointestinal: Negative for abdominal pain, vomiting and diarrhea. Genitourinary: Negative for dysuria. Musculoskeletal: Negative for back pain.  Left foot pain Skin: Negative for rash. Neurological: Negative for headaches, focal weakness or numbness. ____________________________________________  PHYSICAL EXAM:  VITAL SIGNS: ED Triage Vitals  Enc Vitals Group     BP 11/18/20 1222 121/85     Pulse Rate 11/18/20 1222 89     Resp 11/18/20 1222 18     Temp 11/18/20 1222 98 F (36.7 C)     Temp src --      SpO2 11/18/20 1222 100 %     Weight 11/18/20 1405 160 lb 0.9 oz (72.6 kg)     Height 11/18/20 1405 5\' 5"  (1.651 m)     Head Circumference --      Peak Flow --      Pain Score 11/18/20 1223 6  Pain Loc --      Pain Edu? --      Excl. in GC? --     Constitutional: Alert and oriented. Well appearing and in no distress. Head: Normocephalic and atraumatic. Eyes: Conjunctivae are normal. Normal extraocular movements Nose: No congestion/rhinorrhea/epistaxis. Mouth/Throat: Mucous membranes are moist. Neck: Supple. No thyromegaly. Cardiovascular: Normal rate, regular rhythm. Normal distal pulses. Respiratory: Normal respiratory effort. No wheezes/rales/rhonchi. Gastrointestinal: Soft and nontender. No distention. Musculoskeletal: Left foot with tenderness to palpation over the lateral aspect of the foot.  Office deformity or ecchymosis is appreciated.   Nontender with normal range of motion in all extremities.  Neurologic: Antalgic gait without ataxia. Normal speech and language. No gross focal neurologic deficits are appreciated. Skin:  Skin is warm, dry and intact. No rash noted. Psychiatric: Mood and affect are normal. Patient exhibits appropriate insight and judgment. ____________________________________________    {LABS (pertinent positives/negatives)  Labs Reviewed  BASIC METABOLIC PANEL - Abnormal; Notable for the following components:      Result Value   Potassium 3.2 (*)    BUN 22 (*)    All other components within normal limits  CBC WITH DIFFERENTIAL/PLATELET - Abnormal; Notable for the following components:   RBC 3.73 (*)    Hemoglobin 11.3 (*)    HCT 33.4 (*)    Lymphs Abs 4.2 (*)    All other components within normal limits  RESP PANEL BY RT-PCR (FLU A&B, COVID) ARPGX2   ____________________________________________  {EKG  ____________________________________________   RADIOLOGY Official radiology report(s): DG Foot Complete Left  Result Date: 11/18/2020 CLINICAL DATA:  Pain and swelling after hitting foot on table 2 days ago. EXAM: LEFT FOOT - COMPLETE 3+ VIEW COMPARISON:  None. FINDINGS: There is an acute, obliquely oriented fracture deformity involving the mid and distal third of the fifth metatarsal bone. Mild medial displacement of the distal fracture fragments identified. No additional acute osseous findings. IMPRESSION: Acute fracture involves the fifth metatarsal bone. Electronically Signed   By: Signa Kell M.D.   On: 11/18/2020 15:33   ____________________________________________  PROCEDURES  Cam boot  Procedures ____________________________________________   INITIAL IMPRESSION / ASSESSMENT AND PLAN / ED COURSE  As part of my medical decision making, I reviewed the following data within the electronic MEDICAL RECORD NUMBER Labs reviewed WNL, Radiograph reviewed as noted, and Notes from prior ED  visits  DDX: viral URI, influenza, Covid, hypokalemia  Patient ED evaluation of complains generalized body aches since last week.  She also reports of a mechanical injury resulting in a close fifth metatarsal fracture.  Exam is reassuring and labs are normal without any signs of hypokalemia.  Patient is stable at this time will follow with primary provider through the [facility].  She is placed in a cam boot for her close foot fracture and will follow-up with podiatry as discussed.  Return precautions of been reviewed.  Infantof Villagomez was evaluated in Emergency Department on 11/18/2020 for the symptoms described in the history of present illness. She was evaluated in the context of the global COVID-19 pandemic, which necessitated consideration that the patient might be at risk for infection with the SARS-CoV-2 virus that causes COVID-19. Institutional protocols and algorithms that pertain to the evaluation of patients at risk for COVID-19 are in a state of rapid change based on information released by regulatory bodies including the CDC and federal and state organizations. These policies and algorithms were followed during the patient's care in the ED. ____________________________________________  FINAL CLINICAL  IMPRESSION(S) / ED DIAGNOSES  Final diagnoses:  Closed displaced fracture of fifth metatarsal bone of left foot, initial encounter  Body aches  Viral syndrome      Lissa Hoard, PA-C 11/18/20 1755    Phineas Semen, MD 11/18/20 [phone]

## 2020-11-18 NOTE — ED Notes (Signed)
Pt walks with steady gait, no signs of resp distress ,

## 2020-11-18 NOTE — Discharge Instructions (Addendum)
Your exam and labs are reassuring at this time.  Your x-ray does confirm a fracture of the shaft of the fifth toe.  You been placed in a boot for comfort and protection.  You should follow-up with podiatry for further fracture care.

## 2020-11-18 NOTE — ED Triage Notes (Signed)
Pt comes with c/o generalized body aches. Pt also states injury to left ankle and some swelling. Pt states trouble swallowing. Pt denies any pain when swallowing. Pt speaking in complete sentence, No distress noted.

## 2020-11-18 NOTE — ED Provider Notes (Signed)
Emergency Medicine Provider Triage Evaluation Note  Aracelia Brinson , a 55 y.o. female  was evaluated in triage.  Pt complains of generalized body soreness for the past few days. No known injury. Also feels like she has to "swallow hard." No sore throat or fever.  Review of Systems  Positive: Generalized body aches,  Negative: Weakness of extremities; headache  Physical Exam  There were no vitals taken for this visit. Gen:   Awake, no distress   Resp:  Normal effort  MSK:   Moves extremities without difficulty. Ambulates without assistance. Other:  Medical Decision Making  Medically screening exam initiated at 12:21 PM.  Appropriate orders placed.  Gurleen Larrivee was informed that the remainder of the evaluation will be completed by another provider, this initial triage assessment does not replace that evaluation, and the importance of remaining in the ED until their evaluation is complete.     Chinita Pester, FNP 11/18/20 1223    Dionne Bucy, MD 11/18/20 1950

## 2020-11-18 NOTE — ED Notes (Signed)
Follow up pcp ALL INFO PROVIDED ALL QUESTIONS ANSWERED

## 2023-02-20 ENCOUNTER — Inpatient Hospital Stay (HOSPITAL_COMMUNITY)
Admission: AD | Admit: 2023-02-20 | Discharge: 2023-02-23 | DRG: 897 | Disposition: A | Discharge status: Home / Self Care | Payer: No Typology Code available for payment source | Source: Intra-hospital | Attending: Psychiatry | Admitting: Psychiatry

## 2023-02-20 ENCOUNTER — Encounter (HOSPITAL_COMMUNITY): Payer: Self-pay | Admitting: Psychiatry

## 2023-02-20 ENCOUNTER — Other Ambulatory Visit: Payer: Self-pay

## 2023-02-20 ENCOUNTER — Emergency Department
Admission: EM | Admit: 2023-02-20 | Discharge: 2023-02-20 | Disposition: A | Discharge status: Other Acute Inpatient Hospital | Payer: No Typology Code available for payment source | Attending: Emergency Medicine | Admitting: Emergency Medicine

## 2023-02-20 DIAGNOSIS — Z888 Allergy status to other drugs, medicaments and biological substances status: Secondary | ICD-10-CM | POA: Diagnosis not present

## 2023-02-20 DIAGNOSIS — F1010 Alcohol abuse, uncomplicated: Secondary | ICD-10-CM | POA: Insufficient documentation

## 2023-02-20 DIAGNOSIS — R748 Abnormal levels of other serum enzymes: Secondary | ICD-10-CM | POA: Diagnosis present

## 2023-02-20 DIAGNOSIS — F29 Unspecified psychosis not due to a substance or known physiological condition: Secondary | ICD-10-CM | POA: Diagnosis not present

## 2023-02-20 DIAGNOSIS — F17210 Nicotine dependence, cigarettes, uncomplicated: Secondary | ICD-10-CM | POA: Diagnosis present

## 2023-02-20 DIAGNOSIS — F209 Schizophrenia, unspecified: Secondary | ICD-10-CM

## 2023-02-20 DIAGNOSIS — Z880 Allergy status to penicillin: Secondary | ICD-10-CM | POA: Diagnosis not present

## 2023-02-20 DIAGNOSIS — I10 Essential (primary) hypertension: Secondary | ICD-10-CM | POA: Diagnosis present

## 2023-02-20 DIAGNOSIS — Y906 Blood alcohol level of 120-199 mg/100 ml: Secondary | ICD-10-CM | POA: Diagnosis present

## 2023-02-20 DIAGNOSIS — F05 Delirium due to known physiological condition: Secondary | ICD-10-CM | POA: Diagnosis present

## 2023-02-20 DIAGNOSIS — F22 Delusional disorders: Secondary | ICD-10-CM | POA: Diagnosis present

## 2023-02-20 DIAGNOSIS — E871 Hypo-osmolality and hyponatremia: Secondary | ICD-10-CM | POA: Diagnosis present

## 2023-02-20 DIAGNOSIS — E876 Hypokalemia: Secondary | ICD-10-CM | POA: Diagnosis present

## 2023-02-20 DIAGNOSIS — Z79899 Other long term (current) drug therapy: Secondary | ICD-10-CM | POA: Insufficient documentation

## 2023-02-20 DIAGNOSIS — F10121 Alcohol abuse with intoxication delirium: Principal | ICD-10-CM | POA: Diagnosis present

## 2023-02-20 DIAGNOSIS — Z881 Allergy status to other antibiotic agents status: Secondary | ICD-10-CM | POA: Diagnosis not present

## 2023-02-20 DIAGNOSIS — F23 Brief psychotic disorder: Secondary | ICD-10-CM

## 2023-02-20 DIAGNOSIS — R000 Tachycardia, unspecified: Secondary | ICD-10-CM | POA: Insufficient documentation

## 2023-02-20 DIAGNOSIS — F200 Paranoid schizophrenia: Secondary | ICD-10-CM | POA: Diagnosis not present

## 2023-02-20 DIAGNOSIS — E559 Vitamin D deficiency, unspecified: Secondary | ICD-10-CM | POA: Diagnosis present

## 2023-02-20 DIAGNOSIS — Z818 Family history of other mental and behavioral disorders: Secondary | ICD-10-CM

## 2023-02-20 DIAGNOSIS — E860 Dehydration: Secondary | ICD-10-CM

## 2023-02-20 HISTORY — DX: Alcohol use, unspecified, uncomplicated: F10.90

## 2023-02-20 HISTORY — DX: Schizophrenia, unspecified: F20.9

## 2023-02-20 LAB — COMPREHENSIVE METABOLIC PANEL
ALT: 60 U/L — ABNORMAL HIGH (ref 0–44)
ALT: 58 U/L — ABNORMAL HIGH (ref 0–44)
AST: 97 U/L — ABNORMAL HIGH (ref 15–41)
AST: 90 U/L — ABNORMAL HIGH (ref 15–41)
Albumin: 4.4 g/dL (ref 3.5–5.0)
Albumin: 4.4 g/dL (ref 3.5–5.0)
Alkaline Phosphatase: 52 U/L (ref 38–126)
Alkaline Phosphatase: 56 U/L (ref 38–126)
Anion gap: 19 — ABNORMAL HIGH (ref 5–15)
Anion gap: 16 — ABNORMAL HIGH (ref 5–15)
BUN: 23 mg/dL — ABNORMAL HIGH (ref 6–20)
BUN: 21 mg/dL — ABNORMAL HIGH (ref 6–20)
CO2: 16 mmol/L — ABNORMAL LOW (ref 22–32)
CO2: 20 mmol/L — ABNORMAL LOW (ref 22–32)
Calcium: 8.8 mg/dL — ABNORMAL LOW (ref 8.9–10.3)
Calcium: 8.8 mg/dL — ABNORMAL LOW (ref 8.9–10.3)
Chloride: 93 mmol/L — ABNORMAL LOW (ref 98–111)
Chloride: 96 mmol/L — ABNORMAL LOW (ref 98–111)
Creatinine, Ser: 1.9 mg/dL — ABNORMAL HIGH (ref 0.44–1.00)
Creatinine, Ser: 1.26 mg/dL — ABNORMAL HIGH (ref 0.44–1.00)
GFR, Estimated: 30 mL/min — ABNORMAL LOW (ref >60)
GFR, Estimated: 49 mL/min — ABNORMAL LOW (ref >60)
Glucose, Bld: 126 mg/dL — ABNORMAL HIGH (ref 70–99)
Glucose, Bld: 90 mg/dL (ref 70–99)
Potassium: 2.6 mmol/L — CL (ref 3.5–5.1)
Potassium: 3.3 mmol/L — ABNORMAL LOW (ref 3.5–5.1)
Sodium: 128 mmol/L — ABNORMAL LOW (ref 135–145)
Sodium: 132 mmol/L — ABNORMAL LOW (ref 135–145)
Total Bilirubin: 0.6 mg/dL (ref 0.0–1.2)
Total Bilirubin: 0.6 mg/dL (ref 0.0–1.2)
Total Protein: 8.6 g/dL — ABNORMAL HIGH (ref 6.5–8.1)
Total Protein: 8.6 g/dL — ABNORMAL HIGH (ref 6.5–8.1)

## 2023-02-20 LAB — URINE DRUG SCREEN, QUALITATIVE (ARMC ONLY)
Amphetamines, Ur Screen: NOT DETECTED
Barbiturates, Ur Screen: NOT DETECTED
Benzodiazepine, Ur Scrn: NOT DETECTED
Cannabinoid 50 Ng, Ur ~~LOC~~: NOT DETECTED
Cocaine Metabolite,Ur ~~LOC~~: NOT DETECTED
MDMA (Ecstasy)Ur Screen: NOT DETECTED
Methadone Scn, Ur: NOT DETECTED
Opiate, Ur Screen: NOT DETECTED
Phencyclidine (PCP) Ur S: NOT DETECTED
Tricyclic, Ur Screen: NOT DETECTED

## 2023-02-20 LAB — CBC
HCT: 33.5 % — ABNORMAL LOW (ref 36.0–46.0)
Hemoglobin: 11.8 g/dL — ABNORMAL LOW (ref 12.0–15.0)
MCH: 29.9 pg (ref 26.0–34.0)
MCHC: 35.2 g/dL (ref 30.0–36.0)
MCV: 85 fL (ref 80.0–100.0)
Platelets: 123 10*3/uL — ABNORMAL LOW (ref 150–400)
RBC: 3.94 MIL/uL (ref 3.87–5.11)
RDW: 13.2 % (ref 11.5–15.5)
WBC: 15.1 10*3/uL — ABNORMAL HIGH (ref 4.0–10.5)
nRBC: 0 % (ref 0.0–0.2)

## 2023-02-20 LAB — SALICYLATE LEVEL: Salicylate Lvl: <7 mg/dL — ABNORMAL LOW (ref 7.0–30.0)

## 2023-02-20 LAB — ETHANOL: Alcohol, Ethyl (B): 196 mg/dL — ABNORMAL HIGH (ref <10)

## 2023-02-20 LAB — ACETAMINOPHEN LEVEL: Acetaminophen (Tylenol), Serum: <10 ug/mL — ABNORMAL LOW (ref 10–30)

## 2023-02-20 MED ORDER — HALOPERIDOL LACTATE 5 MG/ML IJ SOLN: 10.0000 mg | Freq: Three times a day (TID) | INTRAMUSCULAR | Status: DC | PRN

## 2023-02-20 MED ORDER — HALOPERIDOL LACTATE 5 MG/ML IJ SOLN: 5.0000 mg | Freq: Three times a day (TID) | INTRAMUSCULAR | Status: DC | PRN

## 2023-02-20 MED ORDER — LORAZEPAM 2 MG/ML IJ SOLN: 2.0000 mg | Freq: Three times a day (TID) | INTRAMUSCULAR | Status: DC | PRN

## 2023-02-20 MED ORDER — LORAZEPAM 1 MG PO TABS: 1.0000 mg | ORAL_TABLET | Freq: Four times a day (QID) | ORAL | Status: DC | PRN

## 2023-02-20 MED ORDER — HALOPERIDOL 5 MG PO TABS: 5.0000 mg | ORAL_TABLET | Freq: Three times a day (TID) | ORAL | Status: DC | PRN

## 2023-02-20 MED ORDER — VITAMIN B-1 100 MG PO TABS
100.0000 mg | ORAL_TABLET | Freq: Every day | ORAL | Status: DC
  Administered 2023-02-21 – 2023-02-23 (×3): 100 mg via ORAL
  Filled 2023-02-20 (×5): qty 1

## 2023-02-20 MED ORDER — LOPERAMIDE HCL 2 MG PO CAPS: 2.0000 mg | ORAL_CAPSULE | ORAL | Status: DC | PRN

## 2023-02-20 MED ORDER — ADULT MULTIVITAMIN W/MINERALS CH
1.0000 | ORAL_TABLET | Freq: Every day | ORAL | Status: DC
  Administered 2023-02-21 – 2023-02-23 (×3): 1 via ORAL
  Filled 2023-02-20 (×5): qty 1

## 2023-02-20 MED ORDER — DIPHENHYDRAMINE HCL 50 MG/ML IJ SOLN: 50.0000 mg | Freq: Three times a day (TID) | INTRAMUSCULAR | Status: DC | PRN

## 2023-02-20 MED ORDER — ACETAMINOPHEN 325 MG PO TABS
650.0000 mg | ORAL_TABLET | Freq: Four times a day (QID) | ORAL | Status: DC | PRN
  Administered 2023-02-21 – 2023-02-22 (×3): 650 mg via ORAL
  Filled 2023-02-20 (×3): qty 2

## 2023-02-20 MED ORDER — DIPHENHYDRAMINE HCL 25 MG PO CAPS: 50.0000 mg | ORAL_CAPSULE | Freq: Three times a day (TID) | ORAL | Status: DC | PRN

## 2023-02-20 MED ORDER — POTASSIUM CHLORIDE CRYS ER 20 MEQ PO TBCR
60.0000 meq | EXTENDED_RELEASE_TABLET | Freq: Once | ORAL | Status: AC
  Administered 2023-02-20: 60 meq via ORAL
  Filled 2023-02-20: qty 3

## 2023-02-20 MED ORDER — HYDROXYZINE HCL 25 MG PO TABS: 25.0000 mg | ORAL_TABLET | Freq: Three times a day (TID) | ORAL | Status: DC | PRN

## 2023-02-20 MED ORDER — ONDANSETRON 4 MG PO TBDP: 4.0000 mg | ORAL_TABLET | Freq: Four times a day (QID) | ORAL | Status: DC | PRN

## 2023-02-20 MED ORDER — ALUM & MAG HYDROXIDE-SIMETH 200-200-20 MG/5ML PO SUSP: 30.0000 mL | ORAL | Status: DC | PRN

## 2023-02-20 MED ORDER — HYDROXYZINE HCL 25 MG PO TABS: 25.0000 mg | ORAL_TABLET | Freq: Four times a day (QID) | ORAL | Status: DC | PRN

## 2023-02-20 MED ORDER — MAGNESIUM HYDROXIDE 400 MG/5ML PO SUSP: 30.0000 mL | Freq: Every day | ORAL | Status: DC | PRN

## 2023-02-20 MED ORDER — TRAZODONE HCL 50 MG PO TABS: 50.0000 mg | ORAL_TABLET | Freq: Every evening | ORAL | Status: DC | PRN

## 2023-02-20 NOTE — ED Notes (Signed)
 Patient attempting to leave at this time MD notified. Patient IVC at this time.

## 2023-02-20 NOTE — BH Assessment (Addendum)
 Comprehensive Clinical Assessment (CCA) Note  02/20/2023 Beverly West [id number]  Chief Complaint:  Chief Complaint  Patient presents with   Psychiatric Evaluation   Visit Diagnosis:   F20.9 Schizophrenia F22 Delusional disorder  Flowsheet Row ED from 02/20/2023 in [facility] Emergency Department at [facility]  C-SSRS RISK CATEGORY No Risk      The patient demonstrates the following risk factors for suicide: Chronic risk factors for suicide include: psychiatric disorder of schizophrenia and substance use disorder. Acute risk factors for suicide include: family or marital conflict. Protective factors for this patient include: positive social support, positive therapeutic relationship, coping skills, and hope for the future. Considering these factors, the overall suicide risk at this point appears to be no risk. Patient is not appropriate for outpatient follow up.  Disposition Beverly Ruth NP, patient meets inpatient criteria.  [facility] contacted and bed availability under review.  Disposition discuss with Pharmacist, Hospital.  RN to discuss with EDP.  Beverly West is a 58 year old female who presents to [facility] via law enforcement  in hand restraints with [employer], and later IVC'd.  Pt's IVC'd reads  Ethanol, dishevel and disorganized thoughts. Per EMS, Pt's family called due to pt off schizophrenia medication x 1.5 and since has consumed heavy amount of alcohol, hallucinating and episode of manic behavior.  Pt denies SI, HI, or AVH.  Pt acknowledged the following symptoms, anxious, irritable, confused, racing thoughts, impulsivity, my family was playing a joke on me, the little boy jumped out the window, and he was gone.  Pt reports sleeping three or four hours during the night.  Pt reports it depends when I eat something, it all depends on the situation.  Pt reports she drinks alcohol, I might drink one beer with one child; I might drink five beers with another child who visited with me,  it depends on what's going on, sometime I will take a shot of liquor.    Pt identifies her primary stressor as with her family playing jokes on her, my son and that little boy tried to break into my house, then they disappeared.  Pt reports she lives alone in her home and her children are her support, they gave me a party in here (hospital), they brought me cake.  Pt reports family history of substance used.  Pt reports no family history of mental illness.  Pt denies any history of abuse or trauma.  Pt denies any current legal problems.  Pt denies any guns or weapons in the home.  Pt says she is not currently receiving weekly outpatient therapy; however[facility].   Pt reports she is not taking prescribed medication , I was diagnosed with schizophrenia twenty years ago, that happen long time ago.  Pt is dressed in scrubs, alert, oriented x 3 with normal speech and restless motor behavior.  Eye contact is good.  Pt mood is anxious and affect is anxious.   Thought process relevant.  Pt's insight is denial and judgment is impaired.  There is some indication pt is currently responding to internal stimuli or experiencing delusional thought content.  Pt was guarded and cooperative throughout assessment.    CCA Screening, Triage and Referral (STR)  Patient Reported Information How did you hear about us ? Family/Friend  What Is the Reason for Your Visit/Call Today? Schizophrenia, Psychosis  How Long Has This Been Causing You Problems? 1 wk - 1 month  What Do You Feel Would Help You the Most Today? Alcohol or Drug Use Treatment; Treatment for Depression or other mood problem   Have You Recently Had Any Thoughts About Hurting Yourself? No (Per IVC'd  danger to self)  Are You Planning to Commit Suicide/Harm Yourself At This time? No   Flowsheet Row ED from 02/20/2023 in [facility] Emergency Department at [facility]   C-SSRS RISK CATEGORY No Risk       Have you Recently Had Thoughts About Hurting Someone Beverly West? No  Are You Planning to Harm Someone at This Time? No  Explanation: none   Have You Used Any Alcohol or Drugs in the Past 24 Hours? Yes  How Long Ago Did You Use Drugs or Alcohol? 24 hours  What Did You Use and How Much? Liqour, Beer   Do You Currently Have a Therapist/Psychiatrist? No  Name of Therapist/Psychiatrist:    Have You Been Recently Discharged From Any Office Practice or Programs? No  Explanation of Discharge From Practice/Program: n/a    CCA Screening Triage Referral Assessment Type of Contact: Face-to-Face  Telemedicine Service Delivery:   Is this Initial or Reassessment?   Date Telepsych consult ordered in [facility]:    Time Telepsych consult ordered in [facility]:    Location of Assessment: [facility] ED  Provider Location: [facility]   Collateral Involvement: TTS attempted to contact Pt's daughter, Beverly West, unsuccessful   Does Patient Have a Automotive Engineer Guardian? No  Legal Guardian Contact Information: -- (n/a)  Copy of Legal Guardianship Form: -- (n/a)  Legal Guardian Notified of Arrival: -- (n/a)  Legal Guardian Notified of Pending Discharge: -- (n/a)  If Minor and Not Living with Parent(s), Who has Custody? -- (n/a)  Is CPS involved or ever been involved? Never  Is APS involved or ever been involved? Never   Patient Determined To Be At Risk for Harm To Self or Others Based on Review of Patient Reported Information or Presenting Complaint? Yes, for Self-Harm  Method: No Plan  Availability of Means: No access or NA  Intent: Vague intent or NA  Notification Required: No need or identified person  Additional Information for Danger to Others Potential: -- (Previous history of schizophrenia)  Additional Comments for Danger to Others Potential: Histor yof schizophrenia  Are There Guns or Other Weapons in Your Home? No  Types  of Guns/Weapons: No guns or weapns in the home  Are These Weapons Safely Secured?                            -- (N/A)  Who Could Verify You Are Able To Have These Secured: N/A (N/A)  Do You Have any Outstanding Charges, Pending Court Dates, Parole/Probation? No  Contacted To Inform of Risk of Harm To Self or Others: Unable to Contact:    Does Patient Present under Involuntary Commitment? Yes    [place] of Residence: [place]   Patient Currently Receiving the Following Services: Medication Management   Determination of Need: Urgent (48 hours)   Options For Referral: Inpatient Hospitalization     CCA Biopsychosocial Patient Reported Schizophrenia/Schizoaffective Diagnosis in Past: Yes   Strengths: Pt is listening   Mental Health Symptoms Depression:  change in energy/activity, Irritability  Duration of Depressive symptoms:    Mania:  Overconfidence; Irritability   Anxiety:   Restlessness; Fatigue; Irritability; Tension   Psychosis:  None   Duration of Psychotic symptoms:  Greater than  two weeks  Trauma:  None   Obsessions:  None   Compulsions:  Intended to reduce stress or prevent another outcome   Inattention:  Does not seem to listen   Hyperactivity/Impulsivity:  None   Oppositional/Defiant Behaviors:  Aggression towards people/animals   Emotional Irregularity:  Potentially harmful impulsivity; Transient, stress-related paranoia/disassociation   Other Mood/Personality Symptoms:  Anxous/Impulsives    Mental Status Exam Appearance and self-care  Stature:  Average   Weight:  Average weight   Clothing:  -- (dressed in scrubs)   Grooming:  -- (dressed in scrubs)   Cosmetic use:  None   Posture/gait:  Tense   Motor activity:  Agitated; Restless   Sensorium  Attention:  Persistent   Concentration:  Anxiety interferes   Orientation:  Person; Place; Situation; Time   Recall/memory:  Normal   Affect and Mood  Affect:  Anxious; Negative;  Restricted   Mood:  Anxious; Irritable; Hypomania   Relating  Eye contact:  None   Facial expression:  Anxious; Responsive; Tense   Attitude toward examiner:  Cooperative; Defensive   Thought and Language  Speech flow: Clear and Coherent   Thought content:  Appropriate to Mood and Circumstances   Preoccupation:  None   Hallucinations:  None   Organization:  Disorganized   Company Secretary of Knowledge:  Fair   Intelligence:  Average   Abstraction:  Functional   Judgement:  Impaired   Reality Testing:  Variable   Insight:  Denial; Gaps   Decision Making:  Impulsive   Social Functioning  Social Maturity:  Impulsive   Social Judgement:  Victimized   Stress  Stressors:  Family conflict; Relationship   Coping Ability:  Overwhelmed; Exhausted   Skill Deficits:  Self-care; Self-control; Decision making   Supports:  Friends/Service system     Religion: Religion/Spirituality Are You A Religious Person?: Yes What is Your Religious Affiliation?: Christian How Might This Affect Treatment?: not assessed  Leisure/Recreation: Leisure / Recreation Do You Have Hobbies?: Yes Leisure and Hobbies: Read  Exercise/Diet: Exercise/Diet Do You Exercise?: Yes What Type of Exercise Do You Do?: Run/Walk How Many Times a Week Do You Exercise?: 1-3 times a week Have You Gained or Lost A Significant Amount of Weight in the Past Six Months?: No Do You Follow a Special Diet?: No Do You Have Any Trouble Sleeping?: Yes Explanation of Sleeping Difficulties: Pt reports her sleep pattern is on and off during the night.   CCA Employment/Education Employment/Work Situation: Employment / Work Situation Employment Situation: Unemployed Patient's Job has Been Impacted by Current Illness: No Has Patient ever Been in Equities Trader?: Yes (Describe in comment) Did You Receive Any Psychiatric Treatment/Services While in the [employer]?: Yes Type of Psychiatric  Treatment/Services in [employer]: Outpatient  Education: Education Is Patient Currently Attending School?: No Last Grade Completed: 12 Did You Product Manager?: No Did You Have An Individualized Education Program (IIEP): No Did You Have Any Difficulty At School?: No Patient's Education Has Been Impacted by Current Illness: No   CCA Family/Childhood History Family and Relationship History: Family history Marital status: Single Does patient have children?: Yes How many children?: 4 How is patient's relationship with their children?: close  Childhood History:  Childhood History By whom was/is the patient raised?: Mother Did patient suffer any verbal/emotional/physical/sexual abuse as a child?: No Did patient suffer from severe childhood neglect?: No Has patient ever been sexually abused/assaulted/raped as an adolescent or adult?: No Was the patient ever a victim of a  crime or a disaster?: No Witnessed domestic violence?: No Has patient been affected by domestic violence as an adult?: No       CCA Substance Use Alcohol/Drug Use: Alcohol / Drug Use Pain Medications: See MRA Prescriptions: See MRA Over the Counter: See MRA History of alcohol / drug use?: Yes Longest period of sobriety (when/how long): Pt reports no sobreity Negative Consequences of Use: Personal relationships Withdrawal Symptoms: Agitation, Aggressive/Assaultive Substance #1 Name of Substance 1: Alcohol 1 - Age of First Use: 13 or 14 1 - Amount (size/oz): Pt reports I might drink one beer with one child; and I might drink five beers with another child,  it all depends which one of my children are present. 1 - Frequency: ongoing 1 - Duration: ongoing 1 - Last Use / Amount: 02/19/23 1 - Method of Aquiring: purchase 1- Route of Use: oral                       ASAM's:  Six Dimensions of Multidimensional Assessment  Dimension 1:  Acute Intoxication and/or Withdrawal Potential:   Dimension 1:   Description of individual's past and current experiences of substance use and withdrawal: Pt reports drinking beer at age 53 or 78. Pt reports, it depends on how much I drink, I might drink one beer with one child or I might drink five beers with another child.  Pt reports she enjoys drinking, I don't plan to stop.  Dimension 2:  Biomedical Conditions and Complications:   Dimension 2:  Description of patient's biomedical conditions and  complications: Pt did not reports bimedical conditions  Dimension 3:  Emotional, Behavioral, or Cognitive Conditions and Complications:  Dimension 3:  Description of emotional, behavioral, or cognitive conditions and complications: Schizophrenia  Dimension 4:  Readiness to Change:  Dimension 4:  Description of Readiness to Change criteria: precontemplation  Dimension 5:  Relapse, Continued use, or Continued Problem Potential:  Dimension 5:  Relapse, continued use, or continued problem potential critiera description: continued used  Dimension 6:  Recovery/Living Environment:  Dimension 6:  Recovery/Iiving environment criteria description: Pt reports she lives in her own home  ASAM Severity Score: ASAM's Severity Rating Score: 10  ASAM Recommended Level of Treatment: ASAM Recommended Level of Treatment: Level II Intensive Outpatient Treatment   Substance use Disorder (SUD) Substance Use Disorder (SUD)  Checklist Symptoms of Substance Use: Continued use despite having a persistent/recurrent physical/psychological problem caused/exacerbated by use, Continued use despite persistent or recurrent social, interpersonal problems, caused or exacerbated by use, Persistent desire or unsuccessful efforts to cut down or control use, Social, occupational, recreational activities given up or reduced due to use, Substance(s) often taken in larger amounts or over longer times than was intended  Recommendations for Services/Supports/Treatments: Recommendations for  Services/Supports/Treatments Recommendations For Services/Supports/Treatments: Inpatient Hospitalization  Disposition Recommendation per psychiatric provider: We recommend inpatient psychiatric hospitalization after medical hospitalization. Patient has been involuntarily committed on 02/20/23.    DSM5 Diagnoses: There are no active problems to display for this patient.    Referrals to Alternative Service(s): Referred to Alternative Service(s):   Place:   Date:   Time:    Referred to Alternative Service(s):   Place:   Date:   Time:    Referred to Alternative Service(s):   Place:   Date:   Time:    Referred to Alternative Service(s):   Place:   Date:   Time:     Beverly West, [facility]

## 2023-02-20 NOTE — ED Notes (Signed)
During EKG pt stated "Yall gonna see, I'm about to sue this whole hospital, you think you can have all those folks in there gambling like that, you'll see I own this place" pt cooperative with EKG, speaking in average volume / tone

## 2023-02-20 NOTE — ED Notes (Signed)
 Pt son here to visit, pt is agreeable.  Seated in dayroom with supervision

## 2023-02-20 NOTE — ED Triage Notes (Addendum)
 Patient arrived via [facility] police department. Per police patient has been off her paranoid schizophrenia medication for a week. Patient hyper verbal, disorganized and loud upon arrival. Patient smells of alcohol.

## 2023-02-20 NOTE — ED Notes (Signed)
 Py laying in bed, all lights on staring blankly at ceiling, staff interact with patient, Beverly West stated she did not need any assistance.

## 2023-02-20 NOTE — Group Note (Signed)
 Date:  02/20/2023 Time:  8:53 PM  Group Topic/Focus:  Wrap-Up Group:   The focus of this group is to help patients review their daily goal of treatment and discuss progress on daily workbooks.    Participation Level:  Did Not Attend  Participation Quality:   Did Not Attend  Affect:   Did Not Attend  Cognitive:   Did Not Attend   Insight: None  Engagement in Group:   Did Not Attend  Modes of Intervention:   Did Not Attend  Additional Comments:  Pt was completing the admission process and did not have the chance to attend group this evening.  Lonni Na 02/20/2023, 8:53 PM
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
## 2023-02-20 NOTE — ED Notes (Signed)
 IVC/Psych Consult Ordered/Pending/Legal Paperwork completed/Scanned & E-filed into chart

## 2023-02-20 NOTE — ED Notes (Signed)
 Hospital meal provided.  100% consumed, pt tolerated w/o complaints.  Waste discarded appropriately.
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
## 2023-02-20 NOTE — ED Notes (Signed)
 Patient refusing potassium replacement medication at this time. Continues to voice that she is ready to go and just wants a "bonnet." Refuses food and drink offered by Clinical research associate.

## 2023-02-20 NOTE — BH Assessment (Signed)
 Patient has been accepted to [facility]. Patient assigned to room 501-1 Attending Physician will be Dr. Evelena  Representative was Cherylynn, [facility].  ER Staff is aware of the admission:  Jestine /Sherry ER Secretary  Dr. Ernest, ER MD  Izetta, Patient's Nurse

## 2023-02-20 NOTE — ED Notes (Signed)
Refusing PO K. Will attempt at a later time.

## 2023-02-20 NOTE — ED Notes (Signed)
Called c com for sheriff's transport to [facility]  [id number]

## 2023-02-20 NOTE — ED Notes (Signed)
 Belongings include:  1  pair of shoes 1 pair of socks 1 phone battery 1 sweatshirt 1 T shirt  1 Gym shorts

## 2023-02-20 NOTE — ED Notes (Signed)
Patient accepted to [facility] Room 501-1 Dr. Abbott Pao accepting [facility] representative

## 2023-02-20 NOTE — ED Notes (Signed)
 Pt came from room to center of day room and stated is my mom and kids still here Staff assured pt that there were no children on the unit then offered the phone for the patient to call, pt denied phone request then walked back into room.  Cont to monitor as ordered

## 2023-02-20 NOTE — ED Notes (Signed)
 Pt oriented to unit was seated in room, requested that tv not be on.  Pt was then overheard by staff having a conversation while alone in room.  Pt then exited room laughing and stated I know what he's about female staff x2 in dayroom with patient, Ms Jabs continued laughing as she stated I know what you want, you can get away from me.  Pt appears to be interacting with internal stimuli.  On psych provider notified. Cont to monitor as ordered

## 2023-02-20 NOTE — Consult Note (Signed)
 Pt's son Kandi came to visit her. Pt gave verbal consent for writer to speak with Kandi.   Spoke w/ Kandi. He states pt lives with his brother Arlon. They do not have a brother named Thom. They do have a sister named Indigo. He states yesterday he received a call from his sister yesterday that pt was having an episode. When he arrived at pt's home, pt was banging on her neighbor's door calling them crackheads. She told him that somebody had snuck into her home last night. He brought her to his home. Things seemed to be ok but then she started telling him that she hears somebody and going into his son's room asking him who he was talking to. He is unaware of whether she takes psychiatric medications. States pt had a similar psychotic episode 10 to 15 years ago. He provided his phone number as [phone].

## 2023-02-20 NOTE — ED Notes (Signed)
 Transferred by [facility] via wheelchair by EDT and security. 1 bag of belongings sent to be locked in [place] locker room. Pt alert and oriented X4, cooperative, RR even and unlabored, color WNL. Pt in NAD.

## 2023-02-20 NOTE — ED Notes (Signed)
 Pt. Transferred to BHU , room# 1 from Main ED .Patient was screened by security before entering the unit. Received Report and Recommendations from [place], [place] . Pt. Oriented to unit including Q15 minute rounding as well as locked bathroom protocol, meal / snack schedule, and  the security cameras in place for their protection. Pt showing no acute signs of distress. Staff to monitor as ordered
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
## 2023-02-20 NOTE — ED Provider Notes (Signed)
 [facility] Provider Note    Event Date/Time   First MD Initiated Contact with Patient 02/20/23 [phone]     (approximate)   History   Behavioral medicine eval   HPI  Beverly West is a 58 y.o. female brought to the ED by [facility] police who state patient has been off her schizophrenia medication for over 1 week.  Patient is disheveled, disorganized, intoxicated and loud upon arrival.  States she does not know why she is here.  Denies active SI/HI.  Voices no medical complaints.     Past Medical History   Past Medical History:  Diagnosis Date  . Carpal tunnel syndrome   . Hypertension      Active Problem List  There are no active problems to display for this patient.    Past Surgical History   Past Surgical History:  Procedure Laterality Date  . CHOLECYSTECTOMY    . TUBAL LIGATION       Home Medications   Prior to Admission medications   Medication Sig Start Date End Date Taking? Authorizing Provider  amLODipine  (NORVASC ) 10 MG tablet Take 10 mg by mouth daily.    [provider]  fluticasone (VERAMYST) 27.5 MCG/SPRAY nasal spray Place 2 sprays into the nose daily.    [provider]  gabapentin (NEURONTIN) 300 MG capsule Take 300 mg by mouth at bedtime.    [provider]  ketotifen (ZADITOR) 0.025 % ophthalmic solution Place 1 drop into both eyes 2 (two) times daily.    [provider]  loratadine (CLARITIN) 10 MG tablet Take 10 mg by mouth daily as needed.     [provider]  montelukast (SINGULAIR) 10 MG tablet Take 10 mg by mouth at bedtime.    [provider]  naproxen sodium (ANAPROX) 220 MG tablet Take 220 mg by mouth 2 (two) times daily as needed.     [provider]  omeprazole (PRILOSEC) 20 MG capsule Take 20 mg by mouth 2 (two) times daily before a meal.    [provider]  potassium chloride  20 MEQ TBCR Take 20 mEq by mouth 2 (two) times daily.  09/20/16 10/05/16  Jacolyn Pae, MD     Allergies  Penicillins and Tetracyclines & related   Family History  History reviewed. No pertinent family history.   Physical Exam  Triage Vital Signs: ED Triage Vitals [02/20/23 0507]  Encounter Vitals Group     BP (!) 138/95     Systolic BP Percentile      Diastolic BP Percentile      Pulse Rate (!) 119     Resp 20     Temp 98.7 F (37.1 C)     Temp Source Oral     SpO2 99 %     Weight      Height      Head Circumference      Peak Flow      Pain Score      Pain Loc      Pain Education      Exclude from Growth Chart     Updated Vital Signs: BP (!) 138/95 (BP Location: Right Arm)   Pulse (!) 119   Temp 98.7 F (37.1 C) (Oral)   Resp 20   SpO2 99%    General: Awake, no distress.  Intoxicated.  Disheveled. CV:  Mildly tachycardic.  Good peripheral perfusion.  Resp:  Normal effort.  CTAB. Abd:  No distention.  Other:  Cooperative.   ED Results / Procedures / Treatments  Labs (all labs ordered are listed, but only abnormal results are displayed) Labs Reviewed  ETHANOL - Abnormal; Notable for the following components:      Result Value   Alcohol, Ethyl (B) 196 (*)    All other components within normal limits  SALICYLATE LEVEL - Abnormal; Notable for the following components:   Salicylate Lvl <7.0 (*)    All other components within normal limits  ACETAMINOPHEN  LEVEL - Abnormal; Notable for the following components:   Acetaminophen  (Tylenol ), Serum <10 (*)    All other components within normal limits  CBC - Abnormal; Notable for the following components:   WBC 15.1 (*)    Hemoglobin 11.8 (*)    HCT 33.5 (*)    Platelets 123 (*)    All other components within normal limits  COMPREHENSIVE METABOLIC PANEL  URINE DRUG SCREEN, QUALITATIVE ([facility] ONLY)     EKG  None   RADIOLOGY None   Official radiology report(s): No results found.   PROCEDURES:  Critical Care performed:  No  Procedures   MEDICATIONS ORDERED IN ED: Medications - No data to display   IMPRESSION / MDM / ASSESSMENT AND PLAN / ED COURSE  I reviewed the triage vital signs and the nursing notes.                             58 year old female brought by [facility] police for disorganized thinking, off schizophrenia medications for over 1 week.  Patient cooperative but trying to leave the premises.  Will place her under IVC for her safety pending psychiatric evaluation.  Patient's presentation is most consistent with exacerbation of chronic illness.  The patient has been placed in psychiatric observation due to the need to provide a safe environment for the patient while obtaining psychiatric consultation and evaluation, as well as ongoing medical and medication management to treat the patient's condition.  The patient has been placed under full IVC at this time.   Clinical Course as of 02/20/23 9380  Wed Feb 20, 2023  0607 Elevated EtOH noted.  Hypokalemia noted; will replete orally.  AKI.  Since patient is not vomiting, she will be encouraged to rehydrate orally. [JS]    Clinical Course User Index [JS] [name], MD     FINAL CLINICAL IMPRESSION(S) / ED DIAGNOSES   Final diagnoses:  Schizophrenia, unspecified type (HCC)  Acute psychosis (HCC)     Rx / DC Orders   ED Discharge Orders     None        Note:  This document was prepared using Dragon voice recognition software and may include unintentional dictation errors.   [name], MD 02/21/23 [phone]

## 2023-02-21 ENCOUNTER — Encounter (HOSPITAL_COMMUNITY): Payer: Self-pay | Admitting: Psychiatry

## 2023-02-21 LAB — HIV ANTIBODY (ROUTINE TESTING W REFLEX): HIV Screen 4th Generation wRfx: NONREACTIVE

## 2023-02-21 LAB — COMPREHENSIVE METABOLIC PANEL
ALT: 60 U/L — ABNORMAL HIGH (ref 0–44)
AST: 72 U/L — ABNORMAL HIGH (ref 15–41)
Albumin: 4.1 g/dL (ref 3.5–5.0)
Alkaline Phosphatase: 52 U/L (ref 38–126)
Anion gap: 16 — ABNORMAL HIGH (ref 5–15)
BUN: 29 mg/dL — ABNORMAL HIGH (ref 6–20)
CO2: 18 mmol/L — ABNORMAL LOW (ref 22–32)
Calcium: 8.5 mg/dL — ABNORMAL LOW (ref 8.9–10.3)
Chloride: 100 mmol/L (ref 98–111)
Creatinine, Ser: 1.18 mg/dL — ABNORMAL HIGH (ref 0.44–1.00)
GFR, Estimated: 54 mL/min — ABNORMAL LOW (ref >60)
Glucose, Bld: 177 mg/dL — ABNORMAL HIGH (ref 70–99)
Potassium: 3.1 mmol/L — ABNORMAL LOW (ref 3.5–5.1)
Sodium: 134 mmol/L — ABNORMAL LOW (ref 135–145)
Total Bilirubin: 0.6 mg/dL (ref 0.0–1.2)
Total Protein: 8.1 g/dL (ref 6.5–8.1)

## 2023-02-21 LAB — LIPID PANEL
Cholesterol: 190 mg/dL (ref 0–200)
HDL: 58 mg/dL (ref >40)
LDL Cholesterol: 108 mg/dL — ABNORMAL HIGH (ref 0–99)
Total CHOL/HDL Ratio: 3.3 {ratio}
Triglycerides: 121 mg/dL (ref <150)
VLDL: 24 mg/dL (ref 0–40)

## 2023-02-21 LAB — TSH: TSH: 1.695 u[IU]/mL (ref 0.350–4.500)

## 2023-02-21 LAB — VITAMIN B12: Vitamin B-12: 457 pg/mL (ref 180–914)

## 2023-02-21 LAB — HEMOGLOBIN A1C
Hgb A1c MFr Bld: 5.5 % (ref 4.8–5.6)
Mean Plasma Glucose: 111.15 mg/dL

## 2023-02-21 LAB — FOLATE: Folate: 14.7 ng/mL (ref >5.9)

## 2023-02-21 LAB — VITAMIN D 25 HYDROXY (VIT D DEFICIENCY, FRACTURES): Vit D, 25-Hydroxy: 6.81 ng/mL — ABNORMAL LOW (ref 30–100)

## 2023-02-21 MED ORDER — HALOPERIDOL 5 MG PO TABS
5.0000 mg | ORAL_TABLET | Freq: Once | ORAL | Status: AC
  Administered 2023-02-21: 5 mg via ORAL
  Filled 2023-02-21: qty 1

## 2023-02-21 MED ORDER — ESCITALOPRAM OXALATE 5 MG PO TABS
5.0000 mg | ORAL_TABLET | Freq: Every day | ORAL | Status: DC
  Administered 2023-02-21: 5 mg via ORAL
  Filled 2023-02-21 (×3): qty 1

## 2023-02-21 MED ORDER — POTASSIUM CHLORIDE CRYS ER 20 MEQ PO TBCR
20.0000 meq | EXTENDED_RELEASE_TABLET | Freq: Every day | ORAL | Status: DC
  Administered 2023-02-21 – 2023-02-22 (×2): 20 meq via ORAL
  Filled 2023-02-21 (×3): qty 1

## 2023-02-21 MED ORDER — AMLODIPINE BESYLATE 10 MG PO TABS
10.0000 mg | ORAL_TABLET | Freq: Every day | ORAL | Status: DC
  Administered 2023-02-21 – 2023-02-23 (×3): 10 mg via ORAL
  Filled 2023-02-21 (×4): qty 1
  Filled 2023-02-21: qty 2

## 2023-02-21 MED ORDER — HALOPERIDOL 5 MG PO TABS
5.0000 mg | ORAL_TABLET | Freq: Two times a day (BID) | ORAL | Status: DC
  Administered 2023-02-21 – 2023-02-22 (×2): 5 mg via ORAL
  Filled 2023-02-21 (×6): qty 1

## 2023-02-21 NOTE — Group Note (Signed)
 Date:  02/21/2023 Time:  8:54 PM  Group Topic/Focus:  Wrap-Up Group:   The focus of this group is to help patients review their daily goal of treatment and discuss progress on daily workbooks.    Participation Level:  Did Not Attend  Participation Quality:  Attentive  Affect:  Appropriate  Cognitive:  Lacking  Insight: Lacking  Engagement in Group:  Engaged  Modes of Intervention:  Discussion  Additional Comments:  Patient was present during the group session but did not share.  Spoke with patient after group and she stated her day was a 4 out of 10.  Patient stated that she just wants to go home and that [facility] and she needs assistant in setting up transportation and she doesn't want to miss it.  Bari Moats 02/21/2023, 8:54 PM

## 2023-02-21 NOTE — Tx Team (Signed)
 Initial Treatment Plan  Beverly West FMW:[id number]    PATIENT STRESSORS: Marital or family conflict   Medication change or noncompliance     PATIENT STRENGTHS: Ability for insight  Average or above average intelligence  Communication skills  General fund of knowledge  Motivation for treatment/growth  Supportive family/friends    PATIENT IDENTIFIED PROBLEMS: Psychosis  I haven't thought about what I want to work on                   DISCHARGE CRITERIA:  Improved stabilization in mood, thinking, and/or behavior Motivation to continue treatment in a less acute level of care Need for constant or close observation no longer present Reduction of life-threatening or endangering symptoms to within safe limits Verbal commitment to aftercare and medication compliance  PRELIMINARY DISCHARGE PLAN: Outpatient therapy Return to previous living arrangement  PATIENT/FAMILY INVOLVEMENT: This treatment plan has been presented to and reviewed with the patient, Beverly West. The patient has been given the opportunity to ask questions and make suggestions.  Charolet Maxcy, RN

## 2023-02-21 NOTE — Progress Notes (Addendum)
 Admission Note:   57 yr female who presents IVC in no acute distress for the treatment of psychosis. Pt's family was concerned she was not taking her schizophrenia medications for 1.5 weeks. Pt was consuming heavy amounts of alcohol, hallucinating, and having episodes of maniac behavior. Per chart, pt was paranoid about the government tracking her through her phone's battery.  Pt has Hx of schizophrenia. Pt reports that her sons arranged a surprise or joke on her since her birthday was approaching. Verbalized that the surprise included a little boy appearing and disappearing under the bed in her home. Reports that she attempted to contact the police to try to remove the little boy from the home but the police were in on it and would not help. Reported getting a knife to try to get the little boy out of the home. Endorsed visual hallucinations of the little boy.     Pt was calm and cooperative with admission process. Pt denies SI and contracts for safety upon admission. Pt denies AH and HI . Pt is unemployed and lives by herself in her apartment. Pt reported typically using glasses but did not being glasses on admission.  Skin was assessed and found to be clear of any abnormal marks.. PT searched and no contraband found, POC and unit policies explained and understanding verbalized. Consents obtained. Food and fluids offered, and fluids accepted. Pt had no additional questions or concerns.

## 2023-02-21 NOTE — Progress Notes (Signed)
 Conversation with patient:    Patient requested to be transferred to the [facility] in [place].  She said that they are familiar with her.     [facility] [place], [phone] x [phone]  [facility] and was informed that patient cannot transfer from one inpatient behavioral hospital to another, unless the current hospital is unable to provide certain services.  He said if patient were at the emergency room, a transfer could be possible.    CSW informed patient and provided her with the phone number to verify the information.  Patient was disappointed but accepted this information.    Teryl Gubler, LCSWA 02/21/2023

## 2023-02-21 NOTE — Group Note (Signed)
 Recreation Therapy Group Note   Group Topic:Communication  Group Date: 02/21/2023 Start Time: 1000 End Time: 1020 Facilitators: Joury Allcorn-McCall, LRT,CTRS Location: [address] Dayroom   Group Topic: Communication, Problem Solving   Goal Area(s) Addresses:  Patient will effectively listen to complete activity.  Patient will identify communication skills used to make activity successful.  Patient will identify how skills used during activity can be used to reach post d/c goals.    Intervention: Building Surveyor Activity - Geometric pattern cards, pencils, blank paper    Activity: Geometric Drawings.  Three volunteers from the peer group will be shown an abstract picture with a particular arrangement of geometrical shapes.  Each round, one 'speaker' will describe the pattern, as accurately as possible without revealing the image to the group.  The remaining group members will listen and draw the picture to reflect how it is described to them. Patients with the role of 'listener' cannot ask clarifying questions but, may request that the speaker repeat a direction. Once the drawings are complete, the presenter will show the rest of the group the picture and compare how close each person came to drawing the picture. LRT will facilitate a post-activity discussion regarding effective communication and the importance of planning, listening, and asking for clarification in daily interactions with others.  Education: Environmental consultant, Active listening, Support systems, Discharge planning  Education Outcome: Acknowledges understanding/In group clarification offered/Needs additional education.   Affect/Mood: N/A   Participation Level: Did not attend    Clinical Observations/Individualized Feedback:      Plan: Continue to engage patient in RT group sessions 2-3x/week.   Jacara Benito-McCall, LRT,CTRS 02/21/2023 12:09 PM

## 2023-02-21 NOTE — Plan of Care (Signed)
   Problem: Education: Goal: Emotional status will improve Outcome: Not Progressing Goal: Mental status will improve Outcome: Not Progressing

## 2023-02-21 NOTE — H&P (Addendum)
 Psychiatric Admission Assessment Adult  Patient Identification: Beverly West  MRN:  [id number]  Date of Evaluation:  02/21/23  Chief Complaint:  Schizophrenia (HCC) [F20.9]   Principal Diagnosis: Schizophrenia (HCC)  Diagnosis:  Principal Problem:   Schizophrenia [facility])    Chief Complaint: A joke went wrong.   History of Present Illness: Beverly West is a 58 y.o. who  has a past medical history of Carpal tunnel syndrome, Hypertension, and Schizophrenia (HCC).  She presented to [facility] for Schizophrenia (HCC).  She was brought in under petition for involuntary commitment by law enforcement due to manic and psychotic behavior.  The patient appears to be a poor and unreliable historian due to active psychosis.  She tells me that her birthday is coming up and that her son was playing a joke on her that someone was in the house.  She states that the son called the police to bring her to the hospital and that they were trying to bring my schizophrenia back.  She believes that she is incorrectly diagnosed with schizophrenia, and states that she does not take medication for it outpatient.  She has no insight into factors leading to her hospitalization, and obtaining a thorough history was limited for this reason.  Collateral was obtained from the patient's son Beverly West.  He reports that he received a call from his sister around 12:00 AM stating that his mother was behaving erratically.  Parents went over to her house to investigate and discovered her outside banging on the neighbors door with a pair of pliers in her hands cursing at them and calling them crack heads.  He states that she told him that there was a skinny guy trying to break into the window.  She lives on the third floor of the apartment building, so Beverly West believe this to be impossible.  He states that she told him that there are people trying to kill her and that she would kill her self before she allowed herself to  be murdered.  He states that he took her back over to his house, and around 2 in the morning she started stating that she was hearing a radio.  She then woke [name] his son up to ask him about the radio at which point parents called an ambulance.  Parents states that when he went to see her at the hospital she called him Beverly West and was asking for someone named Beverly West.  He states that he does not know Beverly West.  He states that his mother has a diagnosis of schizophrenia, but has not had any exacerbations in approximately 10 to 15 years.  Parent states that he spoke with his brother who started noticed she was behaving irrationally starting around Sunday (4 days ago).  He reports that [facility] [place] [facility] she [facility] her [facility].  He states that he has a medication list and will call the unit with it after he gets off of work.   Past Psychiatric History: She  has a past medical history of Carpal tunnel syndrome, Hypertension, and Schizophrenia (HCC).  Obtaining a thorough psychiatric history was limited by the patient's current mental status; however, she does report 1 admission 20 to 25 years ago for schizophrenia.  She states that she is not seeing a psychiatrist or therapist.  She denies a history of suicide attempts.  She reports that she drinks about 4 times a week and has about 4 beers at a time.  This is  likely understated given her elevated liver enzymes.  Is the patient at risk to self?  Yes Has the patient been a risk to self in the past 6 months? No Has the patient been a risk to self within the distant past? No Is the patient a risk to others? No Has the patient been a risk to others in the past 6 months? No Has the patient been a risk to others within the distant past? No  Columbia Scale:  Flowsheet Row [facility] Most recent reading at 02/20/2023  8:00 PM ED from 02/20/2023 in [facility] Most recent reading at 02/20/2023  5:44 AM  C-SSRS RISK CATEGORY No Risk No Risk          Prior Inpatient Therapy: Yes Prior Outpatient Therapy: Yes  Alcohol Screening:  1. How often do you have a drink containing alcohol?: 4 or more times a week 2. How many drinks containing alcohol do you have on a typical day when you are drinking?: 3 or 4 3. How often do you have six or more drinks on one occasion?: Monthly AUDIT-C Score: 7 4. How often during the last year have you found that you were not able to stop drinking once you had started?: Never 5. How often during the last year have you failed to do what was normally expected from you because of drinking?: Never 6. How often during the last year have you needed a first drink in the morning to get yourself going after a heavy drinking session?: Never 7. How often during the last year have you had a feeling of guilt of remorse after drinking?: Never 8. How often during the last year have you been unable to remember what happened the night before because you had been drinking?: Never 9. Have you or someone else been injured as a result of your drinking?: No 10. Has a relative or friend or a doctor or another health worker been concerned about your drinking or suggested you cut down?: No Alcohol Use Disorder Identification Test Final Score (AUDIT): 7 Alcohol Brief Interventions/Follow-up: Alcohol education/Brief advice  Substance Abuse History in the last 12 months: Patient reports abusing alcohol as noted above. Consequences of Substance Abuse: NA  Previous Psychotropic Medications: Yes Psychological Evaluations: No  Past Medical History:  Past Medical History:  Diagnosis Date   Carpal tunnel syndrome    Hypertension    Schizophrenia [facility])      Family Psychiatric & Medical History: Patient reports that she has a half sister with schizophrenia. History reviewed. No pertinent family history.    Tobacco Screening:  Social History   Tobacco Use  Smoking Status Every Day   Types: Cigarettes  Smokeless Tobacco Never      Social History:  Social History   Substance and Sexual Activity  Alcohol Use Yes   Comment: rarely      Additional Social History: Patient reports that she was born in [place]  and moved to [place]  at age 79.  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  She states that she entered as anyone and exited as needed for.  She reports that she was stationed in [place] and afterwards worked in personnel officer.  She states that she last worked about 3 years ago.  She states that she gets a pension  check from the [employer].  She states that she has 4 children ages 40, 74, 54, and 33.  She reports that some of them live nearby.  States that she has 14 grandchildren and sees the ones that live locally fairly frequently.       Allergies:   Allergies  Allergen Reactions   Penicillins     Has patient had a PCN reaction causing immediate rash, facial/tongue/throat swelling, SOB or lightheadedness with hypotension: No Has patient had a PCN reaction causing severe rash involving mucus membranes or skin necrosis: No Has patient had a PCN reaction that required hospitalization: No Has patient had a PCN reaction occurring within the last 10 years: No If all of the above answers are NO, then may proceed with Cephalosporin use.    Tetracyclines & Related     unknown   Venlafaxine     Other Reaction(s): Fatigue     Lab Results:  Results for orders placed or performed during the hospital encounter of 02/20/23 (from the past 48 hours)  Comprehensive metabolic panel     Status: Abnormal   Collection Time: 02/20/23  5:15 AM  Result Value Ref Range   Sodium 128 (L) 135 - 145 mmol/L   Potassium 2.6 (LL) 3.5 - 5.1 mmol/L    Comment: CRITICAL RESULT CALLED TO, READ BACK BY AND VERIFIED  WITH Alford, Jazmine L, RN 02/20/23 0601 MW    Chloride 93 (L) 98 - 111 mmol/L   CO2 16 (L) 22 - 32 mmol/L   Glucose, Bld 126 (H) 70 - 99 mg/dL    Comment: Glucose reference range applies only to samples taken after fasting for at least 8 hours.   BUN 23 (H) 6 - 20 mg/dL   Creatinine, Ser 8.09 (H) 0.44 - 1.00 mg/dL   Calcium 8.8 (L) 8.9 - 10.3 mg/dL   Total Protein 8.6 (H) 6.5 - 8.1 g/dL   Albumin 4.4 3.5 - 5.0 g/dL   AST 97 (H) 15 - 41 U/L   ALT 60 (H) 0 - 44 U/L   Alkaline Phosphatase 52 38 - 126 U/L   Total Bilirubin 0.6 0.0 - 1.2 mg/dL   GFR, Estimated 30 (L) >60 mL/min    Comment: (NOTE) Calculated using the CKD-EPI Creatinine Equation (2021)    Anion gap 19 (H) 5 - 15    Comment: Performed at [facility], [address] [place], [name] [address]  Ethanol     Status: Abnormal   Collection Time: 02/20/23  5:15 AM  Result Value Ref Range   Alcohol, Ethyl (B) 196 (H) <10 mg/dL    Comment: (NOTE) Lowest detectable limit for serum alcohol is 10 mg/dL.  For medical purposes only. Performed at [facility], [address] [place], [name] [address]   Salicylate level     Status: Abnormal   Collection Time: 02/20/23  5:15 AM  Result Value Ref Range   Salicylate Lvl <7.0 (L) 7.0 - 30.0 mg/dL    Comment: Performed at [facility], [address] [place], [name] [address]  Acetaminophen  level     Status: Abnormal   Collection Time: 02/20/23  5:15 AM  Result Value Ref Range   Acetaminophen  (Tylenol ), Serum <10 (L) 10 - 30 ug/mL    Comment: (NOTE) Therapeutic concentrations vary significantly. A range of 10-30 ug/mL  may be an effective concentration for many patients. However, some  are best treated at concentrations outside of this range. Acetaminophen  concentrations >150 ug/mL at  4 hours after ingestion  and >50 ug/mL at 12 hours after ingestion are often associated with  toxic reactions.  Performed at [facility], [address] [place], [name] [address]   cbc     Status: Abnormal   Collection Time: 02/20/23  5:15 AM  Result Value Ref Range   WBC 15.1 (H) 4.0 - 10.5 K/uL   RBC 3.94 3.87 - 5.11 MIL/uL   Hemoglobin 11.8 (L) 12.0 - 15.0 g/dL   HCT 66.4 (L) 63.9 - 53.9 %   MCV 85.0 80.0 - 100.0 fL   MCH 29.9 26.0 - 34.0 pg   MCHC 35.2 30.0 - 36.0 g/dL   RDW 86.7 88.4 - 84.4 %   Platelets 123 (L) 150 - 400 K/uL   nRBC 0.0 0.0 - 0.2 %    Comment: Performed at [facility], [address] [place], [name] [address]  Urine Drug Screen, Qualitative     Status: None   Collection Time: 02/20/23  5:15 AM  Result Value Ref Range   Tricyclic, Ur Screen NONE DETECTED NONE DETECTED   Amphetamines, Ur Screen NONE DETECTED NONE DETECTED   MDMA (Ecstasy)Ur Screen NONE DETECTED NONE DETECTED   Cocaine Metabolite,Ur [place] NONE DETECTED NONE DETECTED   Opiate, Ur Screen NONE DETECTED NONE DETECTED   Phencyclidine (PCP) Ur S NONE DETECTED NONE DETECTED   Cannabinoid 50 Ng, Ur [place] NONE DETECTED NONE DETECTED   Barbiturates, Ur Screen NONE DETECTED NONE DETECTED   Benzodiazepine, Ur Scrn NONE DETECTED NONE DETECTED   Methadone Scn, Ur NONE DETECTED NONE DETECTED    Comment: (NOTE) Tricyclics + metabolites, urine    Cutoff 1000 ng/mL Amphetamines + metabolites, urine  Cutoff 1000 ng/mL MDMA (Ecstasy), urine              Cutoff 500 ng/mL Cocaine Metabolite, urine          Cutoff 300 ng/mL Opiate + metabolites, urine        Cutoff 300 ng/mL Phencyclidine (PCP), urine         Cutoff 25 ng/mL Cannabinoid, urine                 Cutoff 50 ng/mL Barbiturates + metabolites, urine  Cutoff 200 ng/mL Benzodiazepine, urine              Cutoff 200 ng/mL Methadone, urine                   Cutoff 300 ng/mL  The urine drug screen provides only a preliminary, unconfirmed analytical test result and should not be used for non-medical purposes. Clinical consideration and professional judgment should be applied to any positive  drug screen result due to possible interfering substances. A more specific alternate chemical method must be used in order to obtain a confirmed analytical result. Gas chromatography / mass spectrometry (GC/MS) is the preferred confirm atory method. Performed at [facility], [address] [place], [name] [address]   Comprehensive metabolic panel     Status: Abnormal   Collection Time: 02/20/23 12:21 PM  Result Value Ref Range   Sodium 132 (L) 135 - 145 mmol/L   Potassium 3.3 (L) 3.5 - 5.1 mmol/L   Chloride 96 (L) 98 - 111 mmol/L   CO2 20 (L) 22 - 32 mmol/L   Glucose, Bld 90 70 - 99 mg/dL    Comment: Glucose reference range applies only to samples taken after fasting for at least 8 hours.   BUN  21 (H) 6 - 20 mg/dL   Creatinine, Ser 8.73 (H) 0.44 - 1.00 mg/dL   Calcium 8.8 (L) 8.9 - 10.3 mg/dL   Total Protein 8.6 (H) 6.5 - 8.1 g/dL   Albumin 4.4 3.5 - 5.0 g/dL   AST 90 (H) 15 - 41 U/L   ALT 58 (H) 0 - 44 U/L   Alkaline Phosphatase 56 38 - 126 U/L   Total Bilirubin 0.6 0.0 - 1.2 mg/dL   GFR, Estimated 49 (L) >60 mL/min    Comment: (NOTE) Calculated using the CKD-EPI Creatinine Equation (2021)    Anion gap 16 (H) 5 - 15    Comment: Performed at [facility], [address], [place], [name] [address]     Blood Alcohol level:  Lab Results  Component Value Date   ETH 196 (H) 02/20/2023    Metabolic Disorder Labs:  No results found for: HGBA1C, MPG No results found for: PROLACTIN  No results found for: CHOL, TRIG, HDL, VLDL, LDLCALC    Current Medications: Current Facility-Administered Medications  Medication Dose Route Frequency Provider Last Rate Last Admin   acetaminophen  (TYLENOL ) tablet 650 mg  650 mg Oral Q6H PRN Lee, Jacqueline Eun, NP   650 mg at 02/21/23 0600   alum & mag hydroxide-simeth (MAALOX/MYLANTA) 200-200-20 MG/5ML suspension 30 mL  30 mL Oral Q4H PRN Lee, Jacqueline Eun, NP       amLODipine  (NORVASC ) tablet 10 mg   10 mg Oral Daily Kennyth Starleen RAMAN, MD       haloperidol  (HALDOL ) tablet 5 mg  5 mg Oral TID PRN Lee, Jacqueline Eun, NP       And   diphenhydrAMINE  (BENADRYL ) capsule 50 mg  50 mg Oral TID PRN Lee, Jacqueline Eun, NP       haloperidol  lactate (HALDOL ) injection 5 mg  5 mg Intramuscular TID PRN Lee, Jacqueline Eun, NP       And   diphenhydrAMINE  (BENADRYL ) injection 50 mg  50 mg Intramuscular TID PRN Lee, Jacqueline Eun, NP       And   LORazepam  (ATIVAN ) injection 2 mg  2 mg Intramuscular TID PRN Lee, Jacqueline Eun, NP       haloperidol  lactate (HALDOL ) injection 10 mg  10 mg Intramuscular TID PRN Lee, Jacqueline Eun, NP       And   diphenhydrAMINE  (BENADRYL ) injection 50 mg  50 mg Intramuscular TID PRN Lee, Jacqueline Eun, NP       And   LORazepam  (ATIVAN ) injection 2 mg  2 mg Intramuscular TID PRN Lee, Jacqueline Eun, NP       escitalopram  (LEXAPRO ) tablet 5 mg  5 mg Oral QHS Kennyth Starleen RAMAN, MD       haloperidol  (HALDOL ) tablet 5 mg  5 mg Oral BID Kennyth Starleen RAMAN, MD       haloperidol  (HALDOL ) tablet 5 mg  5 mg Oral Once Kennyth Starleen RAMAN, MD       hydrOXYzine  (ATARAX ) tablet 25 mg  25 mg Oral TID PRN Lee, Jacqueline Eun, NP       hydrOXYzine  (ATARAX ) tablet 25 mg  25 mg Oral Q6H PRN Lee, Jacqueline Eun, NP       loperamide  (IMODIUM ) capsule 2-4 mg  2-4 mg Oral PRN Lee, Jacqueline Eun, NP       LORazepam  (ATIVAN ) tablet 1 mg  1 mg Oral Q6H PRN Lee, Jacqueline Eun, NP       magnesium  hydroxide (MILK OF MAGNESIA) suspension 30 mL  30 mL Oral Daily PRN Lee, Jacqueline Eun, NP       multivitamin with minerals tablet 1 tablet  1 tablet Oral Daily Lee, Jacqueline Eun, NP   1 tablet at 02/21/23 9171   ondansetron  (ZOFRAN -ODT) disintegrating tablet 4 mg  4 mg Oral Q6H PRN Lee, Jacqueline Eun, NP       potassium chloride  SA (KLOR-CON  M) CR tablet 20 mEq  20 mEq Oral Daily Monte Zinni S, MD       thiamine  (Vitamin B-1) tablet 100 mg  100 mg Oral Daily Lee, Jacqueline Eun, NP   100 mg at 02/21/23  9171   traZODone  (DESYREL ) tablet 50 mg  50 mg Oral QHS PRN Lee, Jacqueline Eun, NP        PTA Medications: Medications Prior to Admission  Medication Sig Dispense Refill Last Dose/Taking   amLODipine  (NORVASC ) 10 MG tablet Take 10 mg by mouth daily.      atorvastatin (LIPITOR) 40 MG tablet Take 40 mg by mouth daily.      benzonatate (TESSALON) 100 MG capsule Take 100 mg by mouth 3 (three) times daily as needed for cough.      clotrimazole (LOTRIMIN) 1 % external solution Apply 1 Application topically 2 (two) times daily.      fluticasone (VERAMYST) 27.5 MCG/SPRAY nasal spray Place 2 sprays into the nose daily. (Patient not taking: Reported on 02/20/2023)      gabapentin (NEURONTIN) 300 MG capsule Take 300 mg by mouth at bedtime.      ketotifen (ZADITOR) 0.025 % ophthalmic solution Place 1 drop into both eyes 2 (two) times daily.      loratadine (CLARITIN) 10 MG tablet Take 10 mg by mouth daily as needed.       montelukast (SINGULAIR) 10 MG tablet Take 10 mg by mouth at bedtime.      naproxen sodium (ANAPROX) 220 MG tablet Take 220 mg by mouth 2 (two) times daily as needed.  (Patient not taking: Reported on 02/20/2023)      omeprazole (PRILOSEC) 20 MG capsule Take 20 mg by mouth 2 (two) times daily before a meal. (Patient not taking: Reported on 02/20/2023)      pantoprazole (PROTONIX) 40 MG tablet Take 40 mg by mouth daily. (Patient not taking: Reported on 02/20/2023)      PARoxetine (PAXIL) 20 MG tablet Take 0.5 tablets by mouth at bedtime. (Patient not taking: Reported on 02/20/2023)      potassium chloride  20 MEQ TBCR Take 20 mEq by mouth 2 (two) times daily. 30 tablet 0    potassium chloride  SA (KLOR-CON  M) 20 MEQ tablet Take 20 mEq by mouth daily.        Musculoskeletal: Strength & Muscle Tone: within normal limits Gait & Station: normal Patient leans: N/A    Psychiatric Specialty Exam:  Presentation  General Appearance: Bizarre; Disheveled  Eye Contact: Poor  Speech:  Blocked  Speech Volume: Normal  Handedness: Right   Mood and Affect  Mood: Anxious; Depressed  Affect: Restricted; Congruent   Thought Process  Thought Processes: Disorganized  Descriptions of Associations: Tangential  Orientation: Partial  Thought Content: Illogical; Tangential  History of Schizophrenia/Schizoaffective disorder: No  Duration of Psychotic Symptoms: NA Hallucinations: Hallucinations: None  Ideas of Reference: None  Suicidal Thoughts: Suicidal Thoughts: No  Homicidal Thoughts: Homicidal Thoughts: No   Sensorium  Memory: Immediate Fair  Judgment: Poor  Insight: Poor   Executive Functions  Concentration: Fair  Attention Span: Fair  Recall: [employer]  of Knowledge: Good  Language: Good   Psychomotor Activity  Psychomotor Activity: Psychomotor Activity: Normal   Assets  Assets: Communication Skills; Leisure Time   Sleep  Sleep: Sleep: Fair    Physical Exam: General: Sitting comfortably. NAD. HEENT: Normocephalic, atraumatic, MMM, EMOI Lungs: no increased work of breathing noted Heart: no cyanosis Abdomen: Non distended Musculoskeletal: FROM. No obvious deformities Skin: Warm, dry, intact. No rashes noted Neuro: No obvious focal deficits.  Gait and station are normal  Review of Systems  Constitutional: Negative.   HENT: Negative.    Eyes: Negative.   Respiratory: Negative.    Cardiovascular: Negative.   Gastrointestinal: Negative.   Genitourinary: Negative.   Skin: Negative.   Neurological: Negative.   Psychiatric/Behavioral:  Positive for psychosis.     Blood pressure (!) 117/97, pulse (!) 113, temperature 99.3 F (37.4 C), temperature source Oral, resp. rate 18, height 5' 5 (1.651 m), weight 72 kg, SpO2 100%. Body mass index is 26.43 kg/m.   Treatment Plan Summary: ASSESSMENT: Cecila Satcher is an 58 y.o. female who  has a past medical history of Carpal tunnel syndrome, Hypertension, and Schizophrenia  (HCC).  She presented on 02/20/2023  7:37 PM for Schizophrenia [facility]).  She was brought in IVC by the police for decompensated schizophrenia.  According to family, she has had no episodes for 10 to 15 years prior to this admission.  Diagnoses / Active Problems: Patient Active Problem List   Diagnosis Date Noted   Paranoia (HCC) 02/20/2023   Schizophrenia (HCC) 02/20/2023     PLAN: Safety and Monitoring:  -- Involuntary admission to inpatient psychiatric unit for safety, stabilization and treatment  -- Daily contact with patient to assess and evaluate symptoms and progress in treatment  -- Patient's case to be discussed in multi-disciplinary team meeting  -- Observation Level : q15 minute checks  -- Vital signs:  q12 hours  -- Precautions: suicide, elopement, and assault  2. Psychiatric Diagnoses and Treatment:   -Haldol  5 mg twice daily for schizophrenia -Lexapro  10 mg nightly for mood -Trazodone  50 mg nightly as needed for insomnia  3. Medical Issues Being Addressed:    Labs reviewed, remarkable for hyponatremia of 132, hypokalemia of 3.3, elevated creatinine at 1.26, hypocalcemia of 8.8, anion gap of 16, AST of 90 with ALT of 58, GFR of 49 improved from 30 at previous lab draw.  GFR was greater than 60 in 2022.  Anemia with hemoglobin 11.8, mild thrombocytopenia of 123 (173 on previous draw), BAL was 196 on admission.   4. Discharge Planning:   -- Social work and case management to assist with discharge planning and identification of hospital follow-up needs prior to discharge  -- Estimated LOS: 7 to 10 days  -- Discharge Concerns: Need to establish a safety plan; Medication compliance and effectiveness  -- Discharge Goals: Return home with outpatient referrals for mental health follow-up including medication management/psychotherapy  5. Short Term Goals:  Improve ability to identify changes in lifestyle to reduce recurrence of condition, verbalize feelings, disclose and discuss  suicidal ideas, demonstrate self-control, identify and develop effective coping behaviors, compliance with prescribed medications, identify triggers associated with substance abuse/mental health issues, participate in unit milieu and in scheduled group therapies   6. Long Term Goals: Improvement in symptoms so the patient is ready for discharge   --The risks/benefits/side-effects/alternatives to the medications above were discussed in detail with the patient and time was given for questions. The patient provided informed consent.   -- Metabolic profile  and EKG monitoring obtained while on an atypical antipsychotic and listed in the EHR    Total Time Spent in Direct Patient Care:  I personally spent 60 minutes on the unit in direct patient care. The direct patient care time included face-to-face time with the patient, reviewing the patient's chart, communicating with other professionals, and coordinating care. Greater than 50% of this time was spent in counseling or coordinating care with the patient regarding goals of hospitalization, psycho-education, and discharge planning needs.   I certify that inpatient services furnished can reasonably be expected to improve the patient's condition.    Glendia Kitty, MD 02/21/2023, 11:56 AM      Portions of this note were created using voice recognition software. Minor syntax errors, grammatical content, spelling, or punctuation errors may have occurred unintentionally. Please notify the dino if the meaning of any statement is unclear.

## 2023-02-21 NOTE — Progress Notes (Signed)
 Recreation Therapy Notes  INPATIENT RECREATION THERAPY ASSESSMENT  Patient Details Name: Beverly West MRN: [id number] DOB: 11/21/1965 Today's Date: 02/21/2023       Information Obtained From: Patient  Able to Participate in Assessment/Interview: Yes  Patient Presentation: Alert  Reason for Admission (Per Patient): Other (Comments) (my son)  Patient Stressors:  (None identified)  Coping Skills:   TV, Music, Exercise, Talk, Prayer, Avoidance, Read  Leisure Interests (2+):  Individual - TV (depends on mood)  Frequency of Recreation/Participation: Other (Comment) (Daily)  Awareness of Community Resources:  Yes  Community Resources:  Public Affairs Consultant, Research Scientist (physical Sciences)  Current Use: Yes  If no, Barriers?:    Expressed Interest in [employer] Information: No  [employer] of Residence:  Film/video Editor  Patient Main Form of Transportation: Walk  Patient Strengths:  How to deal with people, cook, spend time with grandkids, take things as they go  Patient Identified Areas of Improvement:  No  Patient Goal for Hospitalization:  hurry up and go home  Current SI (including self-harm):  No  Current HI:  No  Current AVH: No  Staff Intervention Plan: Group Attendance, Collaborate with Interdisciplinary Treatment Team  Consent to Intern Participation: N/A   Beverly West, LRT,CTRS Beverly West 02/21/2023, 12:37 PM

## 2023-02-21 NOTE — Progress Notes (Signed)
 [facility] answered  Tylenol  975 mg tid prn Amlodipine  10 mg daily filled jan 4 Atorvastatin 40 mg daily filled jan 4 Benzoate 100 mg tid prn cough not sent out yet new rx Pepto bismal as needed Clotrimazole cream daily and.or solution bid apply to affected are feet/toes filled jan 25 New rx not sent out yet for gen humib dm bid prn  Mag ox 400 mg daily last filled sept 24 Meloxicam 15 mg daily pain Jan 25 Pantoprazole 40 mg daily  Paroxetine 20 mg 1/2 tab at bedtime dec24 New sore throat spray and lozenges not sent yet  Potassium 20 meq daily jan 25  Cream for dry skin   Buel Cedar

## 2023-02-21 NOTE — BHH Counselor (Signed)
 Adult Comprehensive Assessment  Patient ID: Beverly West, female   DOB: [birth year], 58 y.o.   MRN: [id number]  Information Source: Information source: Patient  Current Stressors:  Patient states their primary concerns and needs for treatment are:: because they made me.  They said I had an episode. Patient states their goals for this hospitilization and ongoing recovery are:: just to get better and leave. Educational / Learning stressors: no Employment / Job issues: no Family Relationships: sometimes Surveyor, Quantity / Lack of resources (include bankruptcy): no Housing / Lack of housing: its getting ready to, if I don't get it straightened out Physical health (include injuries & life threatening diseases): no Social relationships: no Substance abuse: no Bereavement / Loss: no  Living/Environment/Situation:  Living Arrangements: Alone Living conditions (as described by patient or guardian): I live by myself Who else lives in the home?: my son sometimes stays with me How long has patient lived in current situation?: about 10 years What is atmosphere in current home: Comfortable  Family History:  Marital status: Divorced Divorced, when?: He passed away now, but we divorced about 5 or 6 years ago. What types of issues is patient dealing with in the relationship?: None really, just the basic ups and downs. Additional relationship information: none reported Are you sexually active?: Yes What is your sexual orientation?: heterosexual Has your sexual activity been affected by drugs, alcohol, medication, or emotional stress?: no Does patient have children?: Yes How many children?: 4 How is patient's relationship with their children?: It was okay, until recently  Childhood History:  By whom was/is the patient raised?: Both parents Additional childhood history information: just an average childhood Description of patient's relationship with  caregiver when they were a child: Me and my mom had a good relationship.  I had a good relationship with my siblings too. Patient's description of current relationship with people who raised him/her: Both passed away. How were you disciplined when you got in trouble as a child/adolescent?: Basially nothing.  I'm the baby, so I'm spoiled. Does patient have siblings?: Yes Description of patient's current relationship with siblings: 6 brothers and 4 sisters Did patient suffer any verbal/emotional/physical/sexual abuse as a child?: No Did patient suffer from severe childhood neglect?: No Has patient ever been sexually abused/assaulted/raped as an adolescent or adult?: No Was the patient ever a victim of a crime or a disaster?: No Witnessed domestic violence?: No Has patient been affected by domestic violence as an adult?: No  Education:  Highest grade of school patient has completed: 12th grade Currently a student?: No Learning disability?: No  Employment/Work Situation:   Employment Situation: Unemployed Patient's Job has Been Impacted by Current Illness: No What is the Longest Time Patient has Held a Job?: maybe 5 to 6 years Where was the Patient Employed at that Time?: I have no idea Has Patient ever Been in the 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  Financial Resources:   Does patient have a representative payee or guardian?: No  Alcohol/Substance Abuse:   What has been your use of drugs/alcohol within the last 12 months?: Alcohol:  3 to 4 times per week.  Patient said that she drinks socially. If attempted suicide, did drugs/alcohol play a role in this?: No If yes, describe treatment: no Has alcohol/substance abuse  ever caused legal  problems?: No  Social Support System:   Patient's Community Support System: Good Describe Community Support System: My family:  my siblings and my children. Type of faith/religion: no How does patient's faith help to cope with current illness?: none reported  Leisure/Recreation:   Do You Have Hobbies?: Yes Leisure and Hobbies: nothing  Strengths/Needs:   What is the patient's perception of their strengths?: Normally, I'm a happy-go-lucky person. Patient states they can use these personal strengths during their treatment to contribute to their recovery: Because I'm a people person.  When I'm happy, other people tend to be happy. Patient states these barriers may affect/interfere with their treatment: Nothing, normally I keept to myself at home.  When I'm around people, it's just my family. Patient states these barriers may affect their return to the community: none reported Other important information patient would like considered in planning for their treatment: none reported  Discharge Plan:   Patient states concerns and preferences for aftercare planning are: Patient reported she wants to be discharged to the hospital in [place].  She receives medical care there, and reported that she has an upcoming appointment with therapist on Monday. Patient states they will know when they are safe and ready for discharge when: I'm ready to be discharged now. Does patient have access to transportation?: No Does patient have financial barriers related to discharge medications?: Yes Patient description of barriers related to discharge medications: none reported Plan for no access to transportation at discharge: Transportation to my appointments at home in [place], is not a problem.  I just take the train. Will patient be returning to same living situation after discharge?: Yes  Summary/Recommendations:   Summary and Recommendations (to be completed by the evaluator):  Beverly West is a 58 year old woman involuntarily admitted to [facility] due to not taking her medications for a week, being intoxicated (alcohol), and experiencing hallucinations and manic behavior.  Patient has a home through [facility] and has been living there for 10 years.  She said that sometimes her son stays with her.  Patient is receiving financial benefits through the [place]. When asked about substance use, she reported drinking 3 to 4 times per week and stated that she drinks socially.  Patient didn't report any trauma but added that her family sometimes stresses her out.  She said her son will drop off her clothes and may visit, but she declined consent for anything other than calling [facility] to report that she was in the hospital when she had an appointment.  At discharge, she will return to her home in [place].  While here, Beverly West can benefit from crisis stabilization, medication management, therapeutic milieu, and referrals for services.   Beverly West, LCSWA  02/21/2023

## 2023-02-21 NOTE — Progress Notes (Signed)
 Dar Note: Patient presents with a calm affect and mood.  Denies suicidal thoughts, auditory and visual hallucinations.  Medications given as prescribed.  Routine safety checks maintained.  Patient visible in milieu with minimal interaction.  Patient is safe on and off the unit.

## 2023-02-21 NOTE — BHH Suicide Risk Assessment (Signed)
 [facility] Admission Suicide Risk Assessment   Nursing information obtained from:  Patient Demographic factors:  Living alone, Unemployed Current Mental Status:  NA Loss Factors:  NA Historical Factors:  Impulsivity Risk Reduction Factors:  Sense of responsibility to family, Positive social support  Total Time spent with patient: 1 hour Principal Problem: Schizophrenia (HCC) Diagnosis:  Principal Problem:   Schizophrenia (HCC)  Subjective Data:  Beverly West is a 58 y.o. female who has a past medical history of Carpal tunnel syndrome and Hypertension. She presented from an outside hospital for Schizophrenia (HCC) [F20.9].  She was she presented under petition for involuntary commitment by law enforcement for manic and psychotic behavior.   Continued Clinical Symptoms:  Alcohol Use Disorder Identification Test Final Score (AUDIT): 7 The Alcohol Use Disorders Identification Test, Guidelines for Use in Primary Care, Second Edition.  World Science Writer [facility]). Score between 0-7:  no or low risk or alcohol related problems. Score between 8-15:  moderate risk of alcohol related problems. Score between 16-19:  high risk of alcohol related problems. Score 20 or above:  warrants further diagnostic evaluation for alcohol dependence and treatment.   CLINICAL FACTORS:   Schizophrenia:   Paranoid or undifferentiated type Previous Psychiatric Diagnoses and Treatments Medical Diagnoses and Treatments/Surgeries   Musculoskeletal: Strength & Muscle Tone: within normal limits Gait & Station: normal Patient leans: N/A  Psychiatric Specialty Exam:  Presentation  General Appearance:  Bizarre; Disheveled  Eye Contact: Poor  Speech: Blocked  Speech Volume: Normal  Handedness: Right   Mood and Affect  Mood: Anxious; Depressed  Affect: Restricted; Congruent   Thought Process  Thought Processes: Disorganized  Descriptions of  Associations:Tangential  Orientation:Partial  Thought Content:Illogical; Tangential  History of Schizophrenia/Schizoaffective disorder:No  Duration of Psychotic Symptoms:No data recorded Hallucinations:Hallucinations: None  Ideas of Reference:None  Suicidal Thoughts:Suicidal Thoughts: No  Homicidal Thoughts:Homicidal Thoughts: No   Sensorium  Memory: Immediate Fair  Judgment: Poor  Insight: Poor   Executive Functions  Concentration: Fair  Attention Span: Fair  Recall: Good  Fund of Knowledge: Good  Language: Good   Psychomotor Activity  Psychomotor Activity: Psychomotor Activity: Normal   Assets  Assets: Communication Skills; Leisure Time   Sleep  Sleep: Sleep: Fair    Physical Exam: General: Sitting comfortably. NAD. HEENT: Normocephalic, atraumatic, MMM, EMOI Lungs: no increased work of breathing noted Heart: no cyanosis Abdomen: Non distended Musculoskeletal: FROM. No obvious deformities Skin: Warm, dry, intact. No rashes noted Neuro: No obvious focal deficits.  Gait and station are normal  Review of Systems  Constitutional: Negative.   HENT: Negative.    Eyes: Negative.   Respiratory: Negative.    Cardiovascular: Negative.   Gastrointestinal: Negative.   Genitourinary: Negative.   Skin: Negative.   Neurological: Negative.   Psychiatric/Behavioral:  Positive for psychosis.    Blood pressure (!) 117/97, pulse (!) 113, temperature 99.3 F (37.4 C), temperature source Oral, resp. rate 18, height 5' 5 (1.651 m), weight 72 kg, SpO2 100%. Body mass index is 26.43 kg/m.   COGNITIVE FEATURES THAT CONTRIBUTE TO RISK:  Loss of executive function    SUICIDE RISK:   Mild:  Suicidal ideation of limited frequency, intensity, duration, and specificity.  There are no identifiable plans, no associated intent, mild dysphoria and related symptoms, good self-control (both objective and subjective assessment), few other risk factors, and  identifiable protective factors, including available and accessible social support.  PLAN OF CARE: Medication management, group therapy, milieu therapy, social work consult.  I certify that  inpatient services furnished can reasonably be expected to improve the patient's condition.   Starleen GORMAN Kitty, MD 02/21/2023, 11:50 AM

## 2023-02-21 NOTE — Progress Notes (Signed)
   02/21/23 0553  15 Minute Checks  Location Dayroom  Visual Appearance Calm  Behavior Composed  Sleep (Behavioral Health Patients Only)  Calculate sleep? (Click Yes once per 24 hr at 0600 safety check) Yes  Documented sleep last 24 hours 8.25

## 2023-02-22 ENCOUNTER — Encounter (HOSPITAL_COMMUNITY): Payer: Self-pay

## 2023-02-22 ENCOUNTER — Encounter (HOSPITAL_COMMUNITY): Payer: Self-pay | Admitting: Psychiatry

## 2023-02-22 DIAGNOSIS — F05 Delirium due to known physiological condition: Secondary | ICD-10-CM | POA: Diagnosis present

## 2023-02-22 DIAGNOSIS — I10 Essential (primary) hypertension: Secondary | ICD-10-CM | POA: Diagnosis present

## 2023-02-22 DIAGNOSIS — E559 Vitamin D deficiency, unspecified: Secondary | ICD-10-CM | POA: Diagnosis present

## 2023-02-22 LAB — RPR: RPR Ser Ql: NONREACTIVE

## 2023-02-22 LAB — COMPREHENSIVE METABOLIC PANEL
ALT: 52 U/L — ABNORMAL HIGH (ref 0–44)
AST: 49 U/L — ABNORMAL HIGH (ref 15–41)
Albumin: 3.9 g/dL (ref 3.5–5.0)
Alkaline Phosphatase: 46 U/L (ref 38–126)
Anion gap: 13 (ref 5–15)
BUN: 29 mg/dL — ABNORMAL HIGH (ref 6–20)
CO2: 21 mmol/L — ABNORMAL LOW (ref 22–32)
Calcium: 8.4 mg/dL — ABNORMAL LOW (ref 8.9–10.3)
Chloride: 101 mmol/L (ref 98–111)
Creatinine, Ser: 1.06 mg/dL — ABNORMAL HIGH (ref 0.44–1.00)
GFR, Estimated: >60 mL/min (ref >60)
Glucose, Bld: 159 mg/dL — ABNORMAL HIGH (ref 70–99)
Potassium: 3.3 mmol/L — ABNORMAL LOW (ref 3.5–5.1)
Sodium: 135 mmol/L (ref 135–145)
Total Bilirubin: 0.6 mg/dL (ref 0.0–1.2)
Total Protein: 7.7 g/dL (ref 6.5–8.1)

## 2023-02-22 LAB — CBC WITH DIFFERENTIAL/PLATELET
Abs Immature Granulocytes: 0.02 10*3/uL (ref 0.00–0.07)
Basophils Absolute: 0 10*3/uL (ref 0.0–0.1)
Basophils Relative: 0 %
Eosinophils Absolute: 0.1 10*3/uL (ref 0.0–0.5)
Eosinophils Relative: 1 %
HCT: 32.7 % — ABNORMAL LOW (ref 36.0–46.0)
Hemoglobin: 11.3 g/dL — ABNORMAL LOW (ref 12.0–15.0)
Immature Granulocytes: 0 %
Lymphocytes Relative: 52 %
Lymphs Abs: 3.8 10*3/uL (ref 0.7–4.0)
MCH: 30.7 pg (ref 26.0–34.0)
MCHC: 34.6 g/dL (ref 30.0–36.0)
MCV: 88.9 fL (ref 80.0–100.0)
Monocytes Absolute: 1 10*3/uL (ref 0.1–1.0)
Monocytes Relative: 13 %
Neutro Abs: 2.5 10*3/uL (ref 1.7–7.7)
Neutrophils Relative %: 34 %
Platelets: 127 10*3/uL — ABNORMAL LOW (ref 150–400)
RBC: 3.68 MIL/uL — ABNORMAL LOW (ref 3.87–5.11)
RDW: 13.8 % (ref 11.5–15.5)
WBC: 7.4 10*3/uL (ref 4.0–10.5)
nRBC: 0 % (ref 0.0–0.2)

## 2023-02-22 MED ORDER — HALOPERIDOL 5 MG PO TABS: 5.0000 mg | ORAL_TABLET | Freq: Every day | ORAL | 0 refills | Status: AC

## 2023-02-22 MED ORDER — POTASSIUM CHLORIDE CRYS ER 20 MEQ PO TBCR: 20.0000 meq | EXTENDED_RELEASE_TABLET | Freq: Every day | ORAL | 0 refills | Status: DC

## 2023-02-22 MED ORDER — POTASSIUM CHLORIDE CRYS ER 20 MEQ PO TBCR: 20.0000 meq | EXTENDED_RELEASE_TABLET | Freq: Once | ORAL | Status: DC

## 2023-02-22 MED ORDER — POTASSIUM CHLORIDE CRYS ER 20 MEQ PO TBCR
20.0000 meq | EXTENDED_RELEASE_TABLET | Freq: Two times a day (BID) | ORAL | Status: DC
Start: 2023-02-22 — End: 2023-02-23
  Administered 2023-02-22 – 2023-02-23 (×2): 20 meq via ORAL
  Filled 2023-02-22 (×5): qty 1

## 2023-02-22 MED ORDER — VITAMIN D (ERGOCALCIFEROL) 1.25 MG (50000 UNIT) PO CAPS
50000.0000 [IU] | ORAL_CAPSULE | ORAL | Status: DC
  Administered 2023-02-22: 50000 [IU] via ORAL
  Filled 2023-02-22 (×2): qty 1

## 2023-02-22 MED ORDER — CHOLECALCIFEROL 125 MCG (5000 UT) PO TABS: 1.0000 | ORAL_TABLET | Freq: Every day | ORAL | 0 refills | Status: AC

## 2023-02-22 MED ORDER — VITAMIN D (ERGOCALCIFEROL) 1.25 MG (50000 UNIT) PO CAPS: 50000.0000 [IU] | ORAL_CAPSULE | ORAL | 0 refills | Status: AC

## 2023-02-22 MED ORDER — VITAMIN D3 25 MCG PO TABS
2000.0000 [IU] | ORAL_TABLET | Freq: Two times a day (BID) | ORAL | Status: DC
  Administered 2023-02-23: 2000 [IU] via ORAL
  Filled 2023-02-22 (×3): qty 2

## 2023-02-22 MED ORDER — HALOPERIDOL 5 MG PO TABS
5.0000 mg | ORAL_TABLET | Freq: Every day | ORAL | Status: DC
  Administered 2023-02-22: 5 mg via ORAL
  Filled 2023-02-22 (×2): qty 1

## 2023-02-22 NOTE — Group Note (Signed)
 Date:  02/22/2023 Time:  1:59 PM  Group Topic/Focus:  Goals Group:   The focus of this group is to help patients establish daily goals to achieve during treatment and discuss how the patient can incorporate goal setting into their daily lives to aide in recovery. Orientation:   The focus of this group is to educate the patient on the purpose and policies of crisis stabilization and provide a format to answer questions about their admission.  The group details unit policies and expectations of patients while admitted.    Participation Level:  None  Participation Quality:   n/a  Affect:  Appropriate  Cognitive:   n/a  Insight: None  Engagement in Group:  None  Modes of Intervention:  Discussion, Orientation, and Rapport Building  Additional Comments:   Pt attended however did not participate in the Orientation and Goals group.  Addison HERO Nickol Collister 02/22/2023, 1:59 PM

## 2023-02-22 NOTE — BHH Suicide Risk Assessment (Signed)
 BHH INPATIENT:  Family/Significant Other Suicide Prevention Education  Suicide Prevention Education:  Education Completed; [name] Right (son) [phone],  (name of family member/significant other) has been identified by the patient as the family member/significant other with whom the patient will be residing, and identified as the person(s) who will aid the patient in the event of a mental health crisis (suicidal ideations/suicide attempt).  With written consent from the patient, the family member/significant other has been provided the following suicide prevention education, prior to the and/or following the discharge of the patient.  Son said that he lives 15 minutes away from his mom.  When asked who else lives with mom, he replied, "My brother."  He said there are no guns or weapons in mom's home.  He said that he and his brother will secure medications and sharp objects (such as knives and scissors) in the house.  He said that he didn't have any safety concerns regarding his mom's return home.  When asked why patient came to the hospital, he replied, "She had a nervous breakdown."  When asked if he had spoken to her recently, he said he spoke with her yesterday, and she "sounded remorseful and like her normal self."  He confirmed that he will pick her up tomorrow, 2/8 at 8 AM.    The suicide prevention education provided includes the following: Suicide risk factors Suicide prevention and interventions National Suicide Hotline telephone number [facility] assessment telephone number [facility] Emergency Assistance 911 [facility] and/or Residential Mobile Crisis Unit telephone number  Request made of family/significant other to: Remove weapons (e.g., guns, rifles, knives), all items previously/currently identified as safety concern.   Remove drugs/medications (over-the-counter, prescriptions, illicit drugs), all items previously/currently identified as a safety  concern.  The family member/significant other verbalizes understanding of the suicide prevention education information provided.  The family member/significant other agrees to remove the items of safety concern listed above.  Janelys Glassner O Kerrington Sova 02/22/2023, 3:27 PM

## 2023-02-22 NOTE — Plan of Care (Signed)
   Problem: Education: Goal: Knowledge of [facility] Education information/materials will improve Outcome: Progressing Goal: Emotional status will improve Outcome: Progressing Goal: Mental status will improve Outcome: Progressing Goal: Verbalization of understanding the information provided will improve Outcome: Progressing

## 2023-02-22 NOTE — Plan of Care (Signed)
  Problem: Health Behavior/Discharge Planning: Goal: Compliance with prescribed medication regimen will improve Outcome: Progressing   

## 2023-02-22 NOTE — Group Note (Signed)
 Date:  02/22/2023 Time:  6:07 PM  Group Topic/Focus:  Dimensions of Wellness:   The focus of this group is to introduce the topic of wellness and discuss the role each dimension of wellness plays in total health.    Participation Level:  Active  Participation Quality:  Appropriate  Affect:  Appropriate  Cognitive:  Appropriate  Insight: Appropriate  Engagement in Group:  Engaged  Modes of Intervention:  Activity and Socialization  Additional Comments:   Pt attended and actively participated in the Social Wellness group. Pt practiced active listening, problem solving and teamwork to complete the social wellness activity.    Addison HERO Raine Blodgett 02/22/2023, 6:07 PM

## 2023-02-22 NOTE — Plan of Care (Signed)

## 2023-02-22 NOTE — Progress Notes (Signed)
  [facility] Adult Case Management Discharge Plan :  Will you be returning to the same living situation after discharge:  Yes,  patient will return to her home  in [place]. At discharge, do you have transportation home?: Yes,  patient's son, [name] Right, [phone], will pick her up on 02/23/2023 at 8 AM.  Do you have the ability to pay for your medications: Yes,  patient has insurance  Release of information consent forms completed and in the chart;  Patient's signature needed at discharge.  Patient to Follow up at:  Follow-up Information     [facility] Follow up.   Why: Per your report, your appointment with therapist is scheduled for 02/25/23.  When you are there, please schedule an appointment for medication management services. Contact information: [address] [place]  [phone]                Next level of care provider has access to [facility] Link:no  Safety Planning and Suicide Prevention discussed: Yes,  [name] Right (son) [phone]     Has patient been referred to the Quitline?: Yes, a printout was provided to patient  Patient has been referred for addiction treatment: No known substance use disorder.  Madylin Fairbank O Antonia Culbertson, LCSWA 02/22/2023, 5:03 PM

## 2023-02-22 NOTE — Group Note (Signed)
 Recreation Therapy Group Note   Group Topic:Problem Solving  Group Date: 02/22/2023 Start Time: 0934 End Time: 0956 Facilitators: Akiem Urieta-McCall, LRT,CTRS Location: [address] Dayroom   Group Topic: Communication, Team Building, Problem Solving  Goal Area(s) Addresses:  Patient will effectively work with peer towards shared goal.  Patient will identify skills used to make activity successful.  Patient will identify how skills used during activity can be used to reach post d/c goals.   Intervention: STEM Activity  Activity: Stage Manager. In teams of 3-5, patients were given 12 plastic drinking straws and an equal length of masking tape. Using the materials provided, patients were asked to build a landing pad to catch a golf ball dropped from approximately 5 feet in the air. All materials were required to be used by the team in their design. LRT facilitated post-activity discussion.  Education: Pharmacist, Community, Scientist, Physiological, Discharge Planning   Education Outcome: Acknowledges education/In group clarification offered/Needs additional education.    Affect/Mood: Appropriate   Participation Level: Minimal   Participation Quality: Independent   Behavior: On-looking   Speech/Thought Process: Relevant   Insight: Moderate   Judgement: Moderate   Modes of Intervention: Problem-solving   Patient Response to Interventions:  Attentive   Education Outcome:  In group clarification offered    Clinical Observations/Individualized Feedback: Pt was quiet and attentive. Pt was observant as peers completed their landing pad.     Plan: Continue to engage patient in RT group sessions 2-3x/week.   Hollace Michelli-McCall, LRT,CTRS 02/22/2023 12:29 PM

## 2023-02-22 NOTE — Progress Notes (Signed)
 [facility] MD Progress Note  02/22/2023 11:09 AM [name]  MRN:  [id number] Principal Problem: Schizophrenia (HCC) Diagnosis: Principal Problem:   Schizophrenia (HCC) Active Problems:   Delirium due to another medical condition   HTN (hypertension)   Vitamin D  deficiency   ID & Admission Information: Beverly West is an 58 y.o. female who  has a past medical history of Alcohol use disorder, Carpal tunnel syndrome, Hypertension, and Schizophrenia (HCC).  She presented on 02/20/2023  7:37 PM for what was initially believed to be decompensated Schizophrenia (HCC).  Given the patient's rapid return to baseline and lab abnormalities at admission, it appears that she may have been delirious rather than psychotic.  Chief Complaint: I feel a lot better today  Subjective:   Case was discussed in the multidisciplinary team. MAR was reviewed and patient was compliant with medications.  No acute events occurred overnight.  She was transferred from the acute unit to the 300 unit.  On exam today, the patient is calm and cooperative.  She is linear in conversation and is not espouse any delusional content.  She denies auditory or visual hallucinations.  She denies suicidal or homicidal ideation.  Given the patient's rapid return to baseline in conjunction with lab abnormalities at admission, I believe that the symptoms leading to her hospitalization were related to delirium rather than to a psychotic break.  Her son had mentioned that when he took her to the hospital she did not recognize him and was calling him [name].  Patient reports that prior to admission she was not suffering from depression or anxiety.  She states it has been a long time since she had taken Paxil, and did not feel like she needs an antidepressant at this time.  We subsequently discussed discontinuing Lexapro .  Upon reviewing her chart it looks like she was prescribed potassium 20 mill equivalents twice a day at 1 time.  As  her potassium remains low on 20 mill equivalents daily, will prescribe twice a day today and recheck tonight.  We discussed the necessity of replacing vitamin D  due to her significant vitamin D  deficiency.  She denies issues with energy, appetite, concentration, or excessive anxiety.  Assuming the patient continues to show signs of improvement we will plan on discharging tomorrow afternoon.   Past Psychiatric and Medical Medical History:  Past Medical History:  Diagnosis Date   Alcohol use disorder    Carpal tunnel syndrome    Hypertension    Schizophrenia (HCC)     Past Surgical History:  Procedure Laterality Date   CHOLECYSTECTOMY     TUBAL LIGATION      Family History(Medical and Psychiatric):  Family History  Problem Relation Age of Onset   Schizophrenia Other        1/2 sister       Social History:  Social History   Substance and Sexual Activity  Alcohol Use Yes   Comment: rarely     Social History   Substance and Sexual Activity  Drug Use No    Social History   Socioeconomic History   Marital status: Single    Spouse name: Not on file   Number of children: Not on file   Years of education: Not on file   Highest education level: Not on file  Occupational History   Not on file  Tobacco Use   Smoking status: Every Day    Types: Cigarettes   Smokeless tobacco: Never  Substance and Sexual Activity   Alcohol use:  Yes    Comment: rarely   Drug use: No   Sexual activity: Not Currently  Other Topics Concern   Not on file  Social History Narrative   Not on file   Social Drivers of Health   Financial Resource Strain: Not on file  Food Insecurity: No Food Insecurity (02/20/2023)   Hunger Vital Sign    Worried About Running Out of Food in the Last Year: Never true    Ran Out of Food in the Last Year: Never true  Transportation Needs: No Transportation Needs (02/20/2023)   PRAPARE - Administrator, Civil Service (Medical): No    Lack of  Transportation (Non-Medical): No  Physical Activity: Not on file  Stress: Not on file  Social Connections: Not on file   Additional Social History: Patient reports that she was born in [place]  and moved to [place]  at age 22.  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  She states that she entered as anyone and exited as needed for.  She reports that she was stationed in [place] and afterwards worked in personnel officer.  She states that she last worked about 3 years ago.  She states that she gets a pension check from [employer].  She states that she has 4 children ages 59, 8, 23, and 44.  She reports that some of them live nearby.  States that she has 14 grandchildren and sees the ones that live locally fairly frequently.        Current Medications: Current Facility-Administered Medications  Medication Dose Route Frequency Provider Last Rate Last Admin   acetaminophen  (TYLENOL ) tablet 650 mg  650 mg Oral Q6H PRN Lee, Jacqueline Eun, NP   650 mg at 02/22/23 1004   alum & mag hydroxide-simeth (MAALOX/MYLANTA) 200-200-20 MG/5ML suspension 30 mL  30 mL Oral Q4H PRN Lee, Jacqueline Eun, NP       amLODipine  (NORVASC ) tablet 10 mg  10 mg Oral Daily Gatlyn Lipari S, MD   10 mg at 02/22/23 1001   haloperidol  (HALDOL ) tablet 5 mg  5 mg Oral TID PRN Lee, Jacqueline Eun, NP       And   diphenhydrAMINE  (BENADRYL ) capsule 50 mg  50 mg Oral TID PRN Lee, Jacqueline Eun, NP       haloperidol  lactate (HALDOL ) injection 5 mg  5 mg Intramuscular TID PRN Lee, Jacqueline Eun, NP       And   diphenhydrAMINE  (BENADRYL ) injection 50 mg  50 mg Intramuscular TID PRN Lee, Jacqueline Eun, NP       And   LORazepam  (ATIVAN ) injection 2 mg  2 mg Intramuscular TID PRN Lee, Jacqueline Eun, NP       haloperidol  lactate (HALDOL ) injection 10 mg  10 mg Intramuscular TID PRN Lee, Jacqueline Eun, NP       And   diphenhydrAMINE  (BENADRYL )  injection 50 mg  50 mg Intramuscular TID PRN Lee, Jacqueline Eun, NP       And   LORazepam  (ATIVAN ) injection 2 mg  2 mg Intramuscular TID PRN Lee, Jacqueline Eun, NP       haloperidol  (HALDOL ) tablet 5 mg  5 mg Oral QHS Kennyth Starleen RAMAN, MD       hydrOXYzine  (ATARAX ) tablet 25 mg  25 mg Oral TID PRN Lee, Jacqueline Eun, NP       hydrOXYzine  (ATARAX ) tablet 25  mg  25 mg Oral Q6H PRN Lee, Jacqueline Eun, NP       loperamide  (IMODIUM ) capsule 2-4 mg  2-4 mg Oral PRN Lee, Jacqueline Eun, NP       LORazepam  (ATIVAN ) tablet 1 mg  1 mg Oral Q6H PRN Lee, Jacqueline Eun, NP       magnesium  hydroxide (MILK OF MAGNESIA) suspension 30 mL  30 mL Oral Daily PRN Lee, Jacqueline Eun, NP       multivitamin with minerals tablet 1 tablet  1 tablet Oral Daily Lee, Jacqueline Eun, NP   1 tablet at 02/22/23 1000   ondansetron  (ZOFRAN -ODT) disintegrating tablet 4 mg  4 mg Oral Q6H PRN Lee, Jacqueline Eun, NP       potassium chloride  SA (KLOR-CON  M) CR tablet 20 mEq  20 mEq Oral BID Antavion Bartoszek S, MD       thiamine  (Vitamin B-1) tablet 100 mg  100 mg Oral Daily Lee, Jacqueline Eun, NP   100 mg at 02/22/23 1001   traZODone  (DESYREL ) tablet 50 mg  50 mg Oral QHS PRN Lee, Jacqueline Eun, NP       Vitamin D  (Ergocalciferol ) (DRISDOL ) 1.25 MG (50000 UNIT) capsule 50,000 Units  50,000 Units Oral Q7 days Latoya Maulding S, MD   50,000 Units at 02/22/23 1008   [START ON 02/23/2023] vitamin D3 (CHOLECALCIFEROL ) tablet 2,000 Units  2,000 Units Oral BID Kennyth Starleen RAMAN, MD        Lab Results:  Results for orders placed or performed during the hospital encounter of 02/20/23 (from the past 48 hours)  Folate     Status: None   Collection Time: 02/21/23  6:19 PM  Result Value Ref Range   Folate 14.7 >5.9 ng/mL    Comment: Performed at [facility], [address] [place], [name] [address]  Hemoglobin A1c     Status: None   Collection Time: 02/21/23  6:19 PM  Result Value Ref Range   Hgb A1c MFr Bld 5.5 4.8 -  5.6 %    Comment: (NOTE) Pre diabetes:          5.7%-6.4%  Diabetes:              >6.4%  Glycemic control for   <7.0% adults with diabetes    Mean Plasma Glucose 111.15 mg/dL    Comment: Performed at [facility], [address] [place], [name] [address]  Lipid panel     Status: Abnormal   Collection Time: 02/21/23  6:19 PM  Result Value Ref Range   Cholesterol 190 0 - 200 mg/dL   Triglycerides 878 <849 mg/dL   HDL 58 >59 mg/dL   Total CHOL/HDL Ratio 3.3 RATIO   VLDL 24 0 - 40 mg/dL   LDL Cholesterol 891 (H) 0 - 99 mg/dL    Comment:        Total Cholesterol/HDL:CHD Risk Coronary Heart Disease Risk Table                     Men   Women  1/2 Average Risk   3.4   3.3  Average Risk       5.0   4.4  2 X Average Risk   9.6   7.1  3 X Average Risk  23.4   11.0        Use the calculated Patient Ratio above and the CHD Risk Table to determine the patient's CHD Risk.  ATP III CLASSIFICATION (LDL):  <100     mg/dL   Optimal  899-870  mg/dL   Near or Above                    Optimal  130-159  mg/dL   Borderline  839-810  mg/dL   High  >809     mg/dL   Very High Performed at [facility], [address], [place], [name] [address]   RPR     Status: None   Collection Time: 02/21/23  6:19 PM  Result Value Ref Range   RPR Ser Ql NON REACTIVE NON REACTIVE    Comment: Performed at [facility], [address], [place], [name] [address]  TSH     Status: None   Collection Time: 02/21/23  6:19 PM  Result Value Ref Range   TSH 1.695 0.350 - 4.500 uIU/mL    Comment: Performed by a 3rd Generation assay with a functional sensitivity of <=0.01 uIU/mL. Performed at [facility], [address] [place], [name] [address]   Vitamin B12     Status: None   Collection Time: 02/21/23  6:19 PM  Result Value Ref Range   Vitamin B-12 457 180 - 914 pg/mL    Comment: (NOTE) This assay is not validated for testing neonatal  or myeloproliferative syndrome specimens for Vitamin B12 levels. Performed at [facility], [address] [place], [name] [address]   VITAMIN D  25 Hydroxy (Vit-D Deficiency, Fractures)     Status: Abnormal   Collection Time: 02/21/23  6:19 PM  Result Value Ref Range   Vit D, 25-Hydroxy 6.81 (L) 30 - 100 ng/mL    Comment: (NOTE) Vitamin D  deficiency has been defined by the Institute of Medicine  and an Endocrine Society practice guideline as a level of serum 25-OH  vitamin D  less than 20 ng/mL (1,2). The Endocrine Society went on to  further define vitamin D  insufficiency as a level between 21 and 29  ng/mL (2).  1. IOM (Institute of Medicine). 2010. Dietary reference intakes for  calcium and D. [place]: [employer]. 2. Holick MF, Binkley [place], Bischoff-Ferrari HA, et al. Evaluation,  treatment, and prevention of vitamin D  deficiency: an Endocrine  Society clinical practice guideline, JCEM. 2011 Jul; 96(7): 1911-30.  Performed at [facility], [address] [place], [name] [address]   Comprehensive metabolic panel     Status: Abnormal   Collection Time: 02/21/23  6:19 PM  Result Value Ref Range   Sodium 134 (L) 135 - 145 mmol/L   Potassium 3.1 (L) 3.5 - 5.1 mmol/L   Chloride 100 98 - 111 mmol/L   CO2 18 (L) 22 - 32 mmol/L   Glucose, Bld 177 (H) 70 - 99 mg/dL    Comment: Glucose reference range applies only to samples taken after fasting for at least 8 hours.   BUN 29 (H) 6 - 20 mg/dL   Creatinine, Ser 8.81 (H) 0.44 - 1.00 mg/dL   Calcium 8.5 (L) 8.9 - 10.3 mg/dL   Total Protein 8.1 6.5 - 8.1 g/dL   Albumin 4.1 3.5 - 5.0 g/dL   AST 72 (H) 15 - 41 U/L   ALT 60 (H) 0 - 44 U/L   Alkaline Phosphatase 52 38 - 126 U/L   Total Bilirubin 0.6 0.0 - 1.2 mg/dL   GFR, Estimated 54 (L) >60 mL/min    Comment: (NOTE)  Calculated using the CKD-EPI Creatinine Equation (2021)    Anion gap 16 (H) 5 - 15    Comment: Performed at [facility], [address], [place], [name] [address]  HIV Antibody (routine testing w rflx)     Status: None   Collection Time: 02/21/23  6:19 PM  Result Value Ref Range   HIV Screen 4th Generation wRfx Non Reactive Non Reactive    Comment: Performed at [facility], [address] [place], [name] [address]  CBC with Differential/Platelet     Status: Abnormal   Collection Time: 02/22/23  6:23 AM  Result Value Ref Range   WBC 7.4 4.0 - 10.5 K/uL   RBC 3.68 (L) 3.87 - 5.11 MIL/uL   Hemoglobin 11.3 (L) 12.0 - 15.0 g/dL   HCT 67.2 (L) 63.9 - 53.9 %   MCV 88.9 80.0 - 100.0 fL   MCH 30.7 26.0 - 34.0 pg   MCHC 34.6 30.0 - 36.0 g/dL   RDW 86.1 88.4 - 84.4 %   Platelets 127 (L) 150 - 400 K/uL   nRBC 0.0 0.0 - 0.2 %   Neutrophils Relative % 34 %   Neutro Abs 2.5 1.7 - 7.7 K/uL   Lymphocytes Relative 52 %   Lymphs Abs 3.8 0.7 - 4.0 K/uL   Monocytes Relative 13 %   Monocytes Absolute 1.0 0.1 - 1.0 K/uL   Eosinophils Relative 1 %   Eosinophils Absolute 0.1 0.0 - 0.5 K/uL   Basophils Relative 0 %   Basophils Absolute 0.0 0.0 - 0.1 K/uL   Immature Granulocytes 0 %   Abs Immature Granulocytes 0.02 0.00 - 0.07 K/uL    Comment: Performed at [facility], [address], [place], [name] [address]    Blood Alcohol level:  Lab Results  Component Value Date   ETH 196 (H) 02/20/2023    Metabolic Disorder Labs: Lab Results  Component Value Date   HGBA1C 5.5 02/21/2023   MPG 111.15 02/21/2023   No results found for: PROLACTIN Lab Results  Component Value Date   CHOL 190 02/21/2023   TRIG 121 02/21/2023   HDL 58 02/21/2023   CHOLHDL 3.3 02/21/2023   VLDL 24 02/21/2023   LDLCALC 108 (H) 02/21/2023    Physical Findings: AIMS:  , ,  ,  ,    CIWA:  CIWA-Ar Total: 0 COWS:     Psychiatric Specialty Exam:  Presentation  General Appearance: Appropriate for Environment  Eye Contact: Good  Speech: Clear and Coherent; Normal Rate  Speech  Volume: Normal  Handedness: Right   Mood and Affect  Mood: Euthymic  Affect: Congruent   Thought Process  Thought Processes: Linear  Descriptions of Associations: Intact  Orientation: Full (Time, Place and Person)  Thought Content: Logical  History of Schizophrenia/Schizoaffective disorder: Yes  Duration of Psychotic Symptoms: NA Hallucinations: Hallucinations: None  Ideas of Reference: None  Suicidal Thoughts: Suicidal Thoughts: No  Homicidal Thoughts: Homicidal Thoughts: No   Sensorium  Memory: Immediate Good  Judgment: Fair  Insight: Fair   Art Therapist  Concentration: Good  Attention Span: Good  Recall: Good  Fund of Knowledge: Good  Language: Good   Psychomotor Activity  Psychomotor Activity: Psychomotor Activity: Normal   Assets  Assets: Communication Skills; Social Support; Housing; Leisure Time   Sleep  Sleep: Sleep: Fair Number of Hours of Sleep: 6.25   Musculoskeletal: Strength & Muscle Tone: within normal limits Gait & Station: normal Patient leans: N/A  Physical Exam: General: Sitting comfortably. NAD. HEENT: Normocephalic, atraumatic, MMM, EMOI Lungs: no increased work of breathing noted Heart: no cyanosis Abdomen: Non distended Musculoskeletal: FROM. No obvious deformities Skin: Warm, dry, intact. No rashes noted Neuro: No obvious focal deficits.  Gait and station are normal  Review of Systems  Constitutional: Negative.   HENT: Negative.    Eyes: Negative.   Respiratory: Negative.    Cardiovascular: Negative.   Gastrointestinal: Negative.   Genitourinary: Negative.   Skin: Negative.   Neurological: Negative.   Psychiatric/Behavioral:  Positive for anxiety.     Blood pressure 112/82, pulse 95, temperature 98.1 F (36.7 C), temperature source Oral, resp. rate 12, height 5' 5 (1.651 m), weight 72 kg, SpO2 100%. Body mass index is 26.43 kg/m.  ASSESSMENT: Beverly West is an 58 y.o. female  who  has a past medical history of Alcohol use disorder, Carpal tunnel syndrome, Hypertension, and Schizophrenia (HCC).  She presented on 02/20/2023  7:37 PM for Schizophrenia [facility]).    Diagnoses / Active Problems: Patient Active Problem List   Diagnosis Date Noted   Delirium due to another medical condition 02/22/2023   HTN (hypertension) 02/22/2023   Vitamin D  deficiency 02/22/2023   Paranoia (HCC) 02/20/2023   Schizophrenia (HCC) 02/20/2023      PLAN: Safety and Monitoring:  -- Involuntary admission to inpatient psychiatric unit for safety, stabilization and treatment  -- Daily contact with patient to assess and evaluate symptoms and progress in treatment  -- Patient's case to be discussed in multi-disciplinary team meeting  -- Observation Level : q15 minute checks  -- Vital signs:  q12 hours  -- Precautions: suicide, elopement, and assault  2. Psychiatric Diagnoses and Treatment:   --  Patient Active Problem List   Diagnosis Date Noted   Delirium due to another medical condition 02/22/2023   HTN (hypertension) 02/22/2023   Vitamin D  deficiency 02/22/2023   Paranoia (HCC) 02/20/2023   Schizophrenia (HCC) 02/20/2023     Scheduled Medications:  amLODipine   10 mg Oral Daily   haloperidol   5 mg Oral QHS   multivitamin with minerals  1 tablet Oral Daily   potassium chloride   20 mEq Oral BID   thiamine   100 mg Oral Daily   Vitamin D  (Ergocalciferol )  50,000 Units Oral Q7 days   [START ON 02/23/2023] cholecalciferol   2,000 Units Oral BID    As Needed Medications: acetaminophen , alum & mag hydroxide-simeth, haloperidol  **AND** diphenhydrAMINE , haloperidol  lactate **AND** diphenhydrAMINE  **AND** LORazepam , haloperidol  lactate **AND** diphenhydrAMINE  **AND** LORazepam , hydrOXYzine , hydrOXYzine , loperamide , LORazepam , magnesium  hydroxide, ondansetron , traZODone     3. Medical Issues Being Addressed:   -- as above   4. Discharge Planning:   -- Social work and case management to  assist with discharge planning and identification of hospital follow-up needs prior to discharge  -- Estimated LOS: Discharge on Saturday to home  -- Discharge Concerns: Need to establish a safety plan; Medication compliance and effectiveness  -- Discharge Goals: Return home with outpatient referrals for mental health follow-up including medication management/psychotherapy  5. Short Term Goals:  Improve ability to identify changes in lifestyle to reduce recurrence of condition, verbalize feelings, disclose and discuss suicidal ideas, demonstrate self-control, identify and develop effective coping behaviors, compliance with prescribed medications, identify triggers associated with substance abuse/mental health issues, participate in unit milieu and in scheduled group therapies   6. Long Term Goals: Improvement in symptoms so the patient is ready for discharge   --The risks/benefits/side-effects/alternatives to the medications above were discussed in  detail with the patient and time was given for questions. The patient provided informed consent.   -- Metabolic profile and EKG monitoring obtained while on an atypical antipsychotic and listed in the EHR    Total Time Spent in Direct Patient Care:  I personally spent 35 minutes on the unit in direct patient care. The direct patient care time included face-to-face time with the patient, reviewing the patient's chart, communicating with other professionals, and coordinating care. Greater than 50% of this time was spent in counseling or coordinating care with the patient regarding goals of hospitalization, psycho-education, and discharge planning needs.      Glendia Kitty, MD Psychiatrist  02/22/2023, 11:09 AM   I certify that inpatient services furnished can reasonably be expected to improve the patient's condition.    Portions of this note were created using voice recognition software. Minor syntax errors, grammatical content, spelling, or  punctuation errors may have occurred unintentionally. Please notify the dino if the meaning of any statement is unclear.

## 2023-02-22 NOTE — Progress Notes (Signed)
   02/21/23 2037  Psych Admission Type (Psych Patients Only)  Admission Status Involuntary  Psychosocial Assessment  Patient Complaints Suspiciousness  Eye Contact Fair  Facial Expression Flat  Affect Flat  Speech Logical/coherent  Interaction Assertive  Motor Activity Other (Comment) (WDL)  Appearance/Hygiene Disheveled  Behavior Characteristics Cooperative  Mood Preoccupied  Thought Process  Coherency Disorganized  Content Blaming others  Delusions None reported or observed  Perception WDL  Hallucination None reported or observed  Judgment Impaired  Confusion None  Danger to Self  Current suicidal ideation? Denies  Agreement Not to Harm Self Yes  Description of Agreement Verbal  Danger to Others  Danger to Others None reported or observed

## 2023-02-22 NOTE — BH IP Treatment Plan (Signed)
 Interdisciplinary Treatment and Diagnostic Plan Update  02/22/2023 Time of Session: 1100am [place] MRN: [id number]  Principal Diagnosis: Schizophrenia [facility])  Secondary Diagnoses: Principal Problem:   Schizophrenia (HCC) Active Problems:   Delirium due to another medical condition   HTN (hypertension)   Vitamin D  deficiency   Current Medications:  Current Facility-Administered Medications  Medication Dose Route Frequency Provider Last Rate Last Admin   acetaminophen  (TYLENOL ) tablet 650 mg  650 mg Oral Q6H PRN Lee, Jacqueline Eun, NP   650 mg at 02/22/23 1004   alum & mag hydroxide-simeth (MAALOX/MYLANTA) 200-200-20 MG/5ML suspension 30 mL  30 mL Oral Q4H PRN Lee, Jacqueline Eun, NP       amLODipine  (NORVASC ) tablet 10 mg  10 mg Oral Daily Parker, Alvin S, MD   10 mg at 02/22/23 1001   haloperidol  (HALDOL ) tablet 5 mg  5 mg Oral TID PRN Lee, Jacqueline Eun, NP       And   diphenhydrAMINE  (BENADRYL ) capsule 50 mg  50 mg Oral TID PRN Lee, Jacqueline Eun, NP       haloperidol  lactate (HALDOL ) injection 5 mg  5 mg Intramuscular TID PRN Lee, Jacqueline Eun, NP       And   diphenhydrAMINE  (BENADRYL ) injection 50 mg  50 mg Intramuscular TID PRN Lee, Jacqueline Eun, NP       And   LORazepam  (ATIVAN ) injection 2 mg  2 mg Intramuscular TID PRN Lee, Jacqueline Eun, NP       haloperidol  lactate (HALDOL ) injection 10 mg  10 mg Intramuscular TID PRN Lee, Jacqueline Eun, NP       And   diphenhydrAMINE  (BENADRYL ) injection 50 mg  50 mg Intramuscular TID PRN Lee, Jacqueline Eun, NP       And   LORazepam  (ATIVAN ) injection 2 mg  2 mg Intramuscular TID PRN Lee, Jacqueline Eun, NP       haloperidol  (HALDOL ) tablet 5 mg  5 mg Oral QHS Parker, Alvin S, MD       hydrOXYzine  (ATARAX ) tablet 25 mg  25 mg Oral TID PRN Lee, Jacqueline Eun, NP       hydrOXYzine  (ATARAX ) tablet 25 mg  25 mg Oral Q6H PRN Lee, Jacqueline Eun, NP       loperamide  (IMODIUM ) capsule 2-4 mg  2-4 mg Oral PRN Lee,  Jacqueline Eun, NP       LORazepam  (ATIVAN ) tablet 1 mg  1 mg Oral Q6H PRN Lee, Jacqueline Eun, NP       magnesium  hydroxide (MILK OF MAGNESIA) suspension 30 mL  30 mL Oral Daily PRN Lee, Jacqueline Eun, NP       multivitamin with minerals tablet 1 tablet  1 tablet Oral Daily Lee, Jacqueline Eun, NP   1 tablet at 02/22/23 1000   ondansetron  (ZOFRAN -ODT) disintegrating tablet 4 mg  4 mg Oral Q6H PRN Lee, Jacqueline Eun, NP       potassium chloride  SA (KLOR-CON  M) CR tablet 20 mEq  20 mEq Oral BID Parker, Alvin S, MD       thiamine  (Vitamin B-1) tablet 100 mg  100 mg Oral Daily Lee, Jacqueline Eun, NP   100 mg at 02/22/23 1001   traZODone  (DESYREL ) tablet 50 mg  50 mg Oral QHS PRN Lee, Jacqueline Eun, NP       Vitamin D  (Ergocalciferol ) (DRISDOL ) 1.25 MG (50000 UNIT) capsule 50,000 Units  50,000 Units Oral Q7 days Parker, Alvin S, MD   50,000 Units at 02/22/23 1008   [  START ON 02/23/2023] vitamin D3 (CHOLECALCIFEROL ) tablet 2,000 Units  2,000 Units Oral BID Parker, Alvin S, MD       PTA Medications: Medications Prior to Admission  Medication Sig Dispense Refill Last Dose/Taking   acetaminophen  (TYLENOL ) 325 MG tablet Take 975 mg by mouth 3 (three) times daily as needed.   Taking As Needed   clotrimazole (LOTRIMIN) 1 % cream Apply 1 Application topically daily.   Taking   meloxicam (MOBIC) 15 MG tablet Take 15 mg by mouth daily.   Taking   olopatadine (PATANOL) 0.1 % ophthalmic solution Place 1 drop into both eyes 2 (two) times daily.   Taking   pantoprazole (PROTONIX) 40 MG tablet Take 40 mg by mouth daily.   Past Month   PARoxetine (PAXIL) 20 MG tablet Take 0.5 tablets by mouth at bedtime.   Past Month   amLODipine  (NORVASC ) 10 MG tablet Take 10 mg by mouth daily.      atorvastatin (LIPITOR) 40 MG tablet Take 40 mg by mouth daily.      benzonatate (TESSALON) 100 MG capsule Take 100 mg by mouth 3 (three) times daily as needed for cough. (Patient not taking: Reported on 02/21/2023)   Not Taking    clotrimazole (LOTRIMIN) 1 % external solution Apply 1 Application topically 2 (two) times daily.      magnesium  oxide (MAG-OX) 400 (240 Mg) MG tablet Take 400 mg by mouth daily. (Patient not taking: Reported on 02/21/2023)   Not Taking   potassium chloride  SA (KLOR-CON  M) 20 MEQ tablet Take 20 mEq by mouth daily.       Patient Stressors: Marital or family conflict   Medication change or noncompliance    Patient Strengths: Ability for insight  Average or above average intelligence  Communication skills  General fund of knowledge  Motivation for treatment/growth  Supportive family/friends   Treatment Modalities: Medication Management, Group therapy, Case management,  1 to 1 session with clinician, Psychoeducation, Recreational therapy.   Physician Treatment Plan for Primary Diagnosis: Schizophrenia (HCC) Long Term Goal(s):     Short Term Goals:    Medication Management: Evaluate patient's response, side effects, and tolerance of medication regimen.  Therapeutic Interventions: 1 to 1 sessions, Unit Group sessions and Medication administration.  Evaluation of Outcomes: Progressing  Physician Treatment Plan for Secondary Diagnosis: Principal Problem:   Schizophrenia (HCC) Active Problems:   Delirium due to another medical condition   HTN (hypertension)   Vitamin D  deficiency  Long Term Goal(s):     Short Term Goals:       Medication Management: Evaluate patient's response, side effects, and tolerance of medication regimen.  Therapeutic Interventions: 1 to 1 sessions, Unit Group sessions and Medication administration.  Evaluation of Outcomes: Progressing   RN Treatment Plan for Primary Diagnosis: Schizophrenia (HCC) Long Term Goal(s): Knowledge of disease and therapeutic regimen to maintain health will improve  Short Term Goals: Ability to verbalize frustration and anger appropriately will improve, Ability to verbalize feelings will improve, Ability to disclose and discuss  suicidal ideas, and Compliance with prescribed medications will improve  Medication Management: RN will administer medications as ordered by provider, will assess and evaluate patient's response and provide education to patient for prescribed medication. RN will report any adverse and/or side effects to prescribing provider.  Therapeutic Interventions: 1 on 1 counseling sessions, Psychoeducation, Medication administration, Evaluate responses to treatment, Monitor vital signs and CBGs as ordered, Perform/monitor CIWA, COWS, AIMS and Fall Risk screenings as ordered, Perform wound care treatments  as ordered.  Evaluation of Outcomes: Progressing   LCSW Treatment Plan for Primary Diagnosis: Schizophrenia (HCC) Long Term Goal(s): Safe transition to appropriate next level of care at discharge, Engage patient in therapeutic group addressing interpersonal concerns.  Short Term Goals: Engage patient in aftercare planning with referrals and resources, Increase ability to appropriately verbalize feelings, Facilitate acceptance of mental health diagnosis and concerns, and Increase skills for wellness and recovery  Therapeutic Interventions: Assess for all discharge needs, 1 to 1 time with Social worker, Explore available resources and support systems, Assess for adequacy in community support network, Educate family and significant other(s) on suicide prevention, Complete Psychosocial Assessment, Interpersonal group therapy.  Evaluation of Outcomes: Progressing   Progress in Treatment: Attending groups: Yes. Participating in groups: No. Taking medication as prescribed: Yes. Toleration medication: Yes. Family/Significant other contact made: No, will contact:  pt declined consents Patient understands diagnosis: Yes. Discussing patient identified problems/goals with staff: Yes. Medical problems stabilized or resolved: Yes. Denies suicidal/homicidal ideation: Yes. Issues/concerns per patient  self-inventory: No.  New problem(s) identified: No, Describe:  none  New Short Term/Long Term Goal(s): medication stabilization, elimination of SI thoughts, development of comprehensive mental wellness plan.    Patient Goals:  Discharge and stay on my medications and vitamins when I leave  Discharge Plan or Barriers: Patient recently admitted. CSW will continue to follow and assess for appropriate referrals and possible discharge planning.    Reason for Continuation of Hospitalization: Medication stabilization Other; describe delirium   Estimated Length of Stay: 1-2 days  Last 3 [place] Suicide Severity Risk Score: Flowsheet Row [facility] (Current[facility] Most recent reading at 02/20/2023  8:00 PM ED from 02/20/2023 in [facility] Emergency Department [facility] Most recent reading at 02/20/2023  5:44 AM  C-SSRS RISK CATEGORY No Risk No Risk       Last PHQ 2/9 Scores:     No data to display          Scribe for Treatment Team: Jenkins LULLA Primer, ISRAEL 02/22/2023 12:43 PM

## 2023-02-22 NOTE — Progress Notes (Signed)
   02/22/23 0900  Psych Admission Type (Psych Patients Only)  Admission Status Involuntary  Psychosocial Assessment  Patient Complaints Suspiciousness  Eye Contact Fair  Facial Expression Flat  Affect Flat  Speech Logical/coherent  Interaction Assertive  Motor Activity  (WNL)  Appearance/Hygiene Disheveled  Behavior Characteristics Cooperative  Mood Preoccupied  Thought Process  Coherency Disorganized  Content Blaming others  Delusions None reported or observed  Perception WDL  Hallucination None reported or observed  Judgment Impaired  Confusion None  Danger to Self  Current suicidal ideation? Denies  Agreement Not to Harm Self Yes  Description of Agreement verbal  Danger to Others  Danger to Others None reported or observed

## 2023-02-22 NOTE — [facility] Suicide Risk Assessment (Signed)
 [facility] Discharge Suicide Risk Assessment   Principal Problem: Schizophrenia [facility])  Discharge Diagnoses: Principal Problem:   Schizophrenia (HCC) Active Problems:   Delirium due to another medical condition   HTN (hypertension)   Vitamin D  deficiency      Total Time spent with patient: 40  Musculoskeletal: Strength & Muscle Tone: within normal limits Gait & Station: normal Patient leans: N/A   Psychiatric Specialty Exam:  Presentation  General Appearance: Appropriate for Environment  Eye Contact: Good  Speech: Clear and Coherent; Normal Rate  Speech Volume: Normal  Handedness: Right   Mood and Affect  Mood: Euthymic  Affect: Congruent   Thought Process  Thought Processes: Linear  Descriptions of Associations: Intact  Orientation: Full (Time, Place and Person)  Thought Content: Logical  History of Schizophrenia/Schizoaffective disorder: Yes  Duration of Psychotic Symptoms: NA Hallucinations: Hallucinations: None  Ideas of Reference: None  Suicidal Thoughts: Suicidal Thoughts: No  Homicidal Thoughts: Homicidal Thoughts: No   Sensorium  Memory: Immediate Good  Judgment: Fair  Insight: Fair   Art Therapist  Concentration: Good  Attention Span: Good  Recall: Good  Fund of Knowledge: Good  Language: Good   Psychomotor Activity  Psychomotor Activity: Psychomotor Activity: Normal   Assets  Assets: Communication Skills; Social Support; Housing; Leisure Time   Sleep  Sleep: Sleep: Fair Number of Hours of Sleep: 6.25   Physical Exam: General: Sitting comfortably. NAD. HEENT: Normocephalic, atraumatic, MMM, EMOI Lungs: no increased work of breathing noted Heart: no cyanosis Abdomen: Non distended Musculoskeletal: FROM. No obvious deformities Skin: Warm, dry, intact. No rashes noted Neuro: No obvious focal deficits.  Gait and station are normal  Review of Systems  Constitutional: Negative.   HENT: Negative.    Eyes:  Negative.   Respiratory: Negative.    Cardiovascular: Negative.   Gastrointestinal: Negative.   Genitourinary: Negative.   Skin: Negative.   Neurological: Negative.   Psychiatric/Behavioral:  Negative   Mental Status Per Nursing Assessment: NA  Demographic Factors:  Divorced or widowed, Low socioeconomic status, Living alone, and Unemployed  Loss Factors: NA  Historical Factors: Family history of mental illness or substance abuse  Risk Reduction Factors:   Sense of responsibility to family, Religious beliefs about death, Positive social support, Positive therapeutic relationship, and Positive coping skills or problem solving skills  Continued Clinical Symptoms:  Previous psychiatric diagnoses and treatments  Cognitive Features That Contribute To Risk:  None  Suicide Risk:  Minimal: No identifiable suicidal ideation.  Patients presenting with no risk factors but with morbid ruminations; may be classified as minimal risk based on the severity of the depressive symptoms.    Follow-up Information     [facility] Follow up.   Why: Please call to reschedule your appointment from 02/25/23, and to schedule a hospital follow up appointment. Contact information: [address] [place]  [phone]                 Plan Of Care/Follow-up recommendations:  Activity: as tolerated  Diet: heart healthy  Other: -Follow-up with your outpatient psychiatric provider -instructions on appointment date, time, and address (location) are provided to you in discharge paperwork.  -Take your psychiatric medications as prescribed at discharge - instructions are provided to you in the discharge paperwork  -Follow-up with outpatient primary care doctor and other specialists -for management of preventative medicine and chronic medical issues  -Testing: Follow-up with outpatient provider for abnormal lab results: Vitamin D  deficiency, hypokalemia,  hyponatremia, anemia, elevated  transaminases, decreased GFR  -If you are prescribed an atypical antipsychotic medication, we recommend that your outpatient psychiatrist follow routine screening for side effects within 3 months of discharge, including monitoring: AIMS scale, height, weight, blood pressure, fasting lipid panel, HbA1c, and fasting blood sugar.   -Recommend total abstinence from alcohol, tobacco, and other illicit drug use at discharge.   -If your psychiatric symptoms recur, worsen, or if you have side effects to your psychiatric medications, call your outpatient psychiatric provider, 911, [phone] or go to the nearest emergency department.  -If suicidal thoughts occur, immediately call your outpatient psychiatric provider, 911, [phone] or go to the nearest emergency department.   Glendia Kitty, MD 02/22/23 12:33 PM

## 2023-02-22 NOTE — Group Note (Signed)
 Date:  02/22/2023 Time:  9:24 PM  Group Topic/Focus:  AA Meeting    Participation Level:  Active  Participation Quality:  Appropriate  Affect:  Appropriate  Cognitive:  Appropriate  Insight: Appropriate  Engagement in Group:  Engaged  Modes of Intervention:  Socialization and Support  Additional Comments:  Patient attended AA  Eward Mace 02/22/2023, 9:24 PM

## 2023-02-23 DIAGNOSIS — F200 Paranoid schizophrenia: Secondary | ICD-10-CM

## 2023-02-23 MED ORDER — POTASSIUM CHLORIDE CRYS ER 20 MEQ PO TBCR: 40.0000 meq | EXTENDED_RELEASE_TABLET | Freq: Every day | ORAL | 0 refills | Status: AC

## 2023-02-23 NOTE — Progress Notes (Signed)
Pt discharged to lobby. Pt was stable and appreciative at that time. All papers and electronic prescriptions were given and valuables returned. Suicide safety plan completed. Copy given to patient. Verbal understanding expressed. Denies SI/HI and A/VH. Pt given opportunity to express concerns and ask questions.

## 2023-02-23 NOTE — Discharge Summary (Signed)
 Physician Discharge Summary Note  Patient:  Beverly West is a 58 y.o. female  MRN:  [id number]  DOB:  Jun 15, 1965  Patient phone: [phone] (home)  Patient address:   [address] [name]   Total Time spent with patient: 52 Minutes  Date of Admission:  02/20/2023  Date of Discharge: 02/23/23   Reason for Admission: The patient was admitted under IVC for erratic behavior and what was believed to be a psychotic break initially.  According to the patient's son, he found her beating on the neighbors door with a pair of pliers cussing amount on calling them crack heads.  She also reported that someone was trying to break in her window; however, she was on the third floor of an apartment so someone would need a very tall ladder to do so.  The son also reported that she was calling him by someone else's name and did not seem to recognize him.  On arrival, the patient had significant hyponatremia, hypokalemia, and possibly an acute kidney injury.  On top of that she had a blood alcohol level of 196.  Her vitamin D  was also quite low at 6.81.  Given the fact that her symptoms resolved quite quickly after electrolyte abnormalities were corrected and she was started on Haldol , it seems to me that this was a hyperactive delirium rather than a state of psychosis.  Though she was diagnosed with schizophrenia many years ago after a brief psychotic episode, she has had no episodes of psychosis for at least 10 to 15 years despite no treatment with antipsychotic medication.  Principal Problem: Schizophrenia [name])  Discharge Diagnoses: Principal Problem:   Schizophrenia (HCC) versus delirium Active Problems:   Delirium due to another medical condition   HTN (hypertension)   Vitamin D  deficiency     Past Psychiatric (and medical) History: Beverly West  has a past medical history of Alcohol use disorder, Carpal tunnel syndrome, Hypertension, and Schizophrenia (HCC). She  does report 1 admission 20 to 25 years ago for schizophrenia. She states that she is not seeing a psychiatrist or therapist. She denies a history of suicide attempts. She reports that she drinks about 4 times a week and has about 4 beers at a time. This may be understated given her elevated liver enzymes.   Past Medical History:  Past Medical History:  Diagnosis Date   Alcohol use disorder    Carpal tunnel syndrome    Hypertension    Schizophrenia (HCC)      Past Surgical History:  Procedure Laterality Date   CHOLECYSTECTOMY     TUBAL LIGATION       Family History:  Family History  Problem Relation Age of Onset   Schizophrenia Other        1/2 sister     Family Psychiatric  History: as above  Social History:  Social History   Substance and Sexual Activity  Alcohol Use Yes   Comment: rarely     Social History   Substance and Sexual Activity  Drug Use No     Social History   Socioeconomic History   Marital status: Single    Spouse name: Not on file   Number of children: Not on file   Years of education: Not on file   Highest education level: Not on file  Occupational History   Not on file  Tobacco Use   Smoking status: Every Day    Types: Cigarettes   Smokeless tobacco: Never  Substance and Sexual Activity   Alcohol use: Yes    Comment: rarely   Drug use: No   Sexual activity: Not Currently  Other Topics Concern   Not on file  Social History Narrative   Not on file   Social Drivers of Health   Financial Resource Strain: Not on file  Food Insecurity: No Food Insecurity (02/20/2023)   Hunger Vital Sign    Worried About Running Out of Food in the Last Year: Never true    Ran Out of Food in the Last Year: Never true  Transportation Needs: No Transportation Needs (02/20/2023)   PRAPARE - Administrator, Civil Service (Medical): No    Lack of Transportation (Non-Medical): No  Physical Activity: Not on file  Stress: Not on file  Social  Connections: Not on file   Additional Social History: Patient reports that she was born in [place]  and moved to [place]  at age 38.  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  She states that she entered as anyone and exited as needed for.  She reports that she was stationed in [place] and afterwards worked in personnel officer.  She states that she last worked about 3 years ago.  She states that she gets a pension check from [employer].  She states that she has 4 children ages 31, 49, 72, and 55.  She reports that some of them live nearby.  States that she has 14 grandchildren and sees the ones that live locally fairly frequently.   Hospital Course:  During the patient's hospitalization, patient had extensive initial psychiatric evaluation, and follow-up psychiatric evaluations every day.  Psychiatric diagnoses provided upon initial assessment: Schizophrenia (HCC) [F20.9]   Patient was started on Haldol  5 mg twice daily after receiving Haldol  5 mg 3 times daily at the hospital prior to admission.  Haldol  was decreased to 5 mg nightly as the patient's symptoms rapidly improved after correction of electrolytes and abstinence from alcohol.  She was started on vitamin D  due to significant vitamin D  deficiency.  Home amlodipine  10 mg was continued.  She was put on 20 mill equivalents of potassium chloride  twice a day.  Repeat BMP was still significant for hypokalemia at this dose, and she was advised to follow-up with her PCP at the [facility] to isolate the etiology of the hypokalemia and make sure it is being replaced adequately.  Patient's care was discussed during the interdisciplinary team meeting every day during the hospitalization.  The patient denied having side effects to prescribed psychiatric medication.  Gradually, patient started adjusting to milieu. The patient was evaluated each day by a clinical provider to  ascertain response to treatment. Improvement was noted by the patient's report of decreasing symptoms, improved sleep and appetite, affect, medication tolerance, behavior, and participation in unit programming.  Patient was asked each day to complete a self inventory noting mood, mental status, pain, new symptoms, anxiety and concerns.    Symptoms were reported as significantly decreased or resolved completely by discharge.   On day of discharge, the patient reports that their mood is stable. The patient denied having suicidal thoughts for more than 48 hours prior to discharge.  Patient denies having homicidal thoughts.  Patient denies having auditory hallucinations.  Patient denies any visual hallucinations or other symptoms of psychosis. The patient was motivated to continue taking medication with a goal of  continued improvement in mental health.   The patient reports their target psychiatric symptoms of confusion responded well to the psychiatric medications, and the patient reports overall benefit other psychiatric hospitalization. Supportive psychotherapy was provided to the patient. The patient also participated in regular group therapy while hospitalized. Coping skills, problem solving as well as relaxation therapies were also part of the unit programming.  Labs were reviewed with the patient, and abnormal results were discussed with the patient.  The patient is able to verbalize their individual safety plan to this provider.    Physical Findings:  AIMS:  Facial and Oral Movements: None Muscles of Facial Expression: None Lips and Perioral Area: None Jaw: None Tongue: None,Extremity Movements Upper (arms, wrists, hands, fingers): None Lower (legs, knees, ankles, toes): None, Trunk Movements Neck, shoulders, hips: None, Global Judgements Severity of abnormal movements overall: None Incapacitation due to abnormal movements: None Patient's awareness of abnormal movements: No Awareness,  Dental Status Current problems with teeth and/or dentures: No Does patient usually wear dentures: No Edentia: No   CIWA:   NA  COWS:  NA  Musculoskeletal: Strength & Muscle Tone: within normal limits Gait & Station: normal Patient leans: N/A    Psychiatric Specialty Exam:  Presentation  General Appearance: Appropriate for Environment  Eye Contact: Good  Speech: Clear and Coherent; Normal Rate  Speech Volume: Normal  Handedness: Right   Mood and Affect  Mood: Euthymic  Affect: Congruent   Thought Process  Thought Processes: Linear  Descriptions of Associations: Intact  Orientation: Full (Time, Place and Person)  Thought Content: Logical  History of Schizophrenia/Schizoaffective disorder: Yes  Duration of Psychotic Symptoms: NA Hallucinations: Hallucinations: None  Ideas of Reference: None  Suicidal Thoughts: Suicidal Thoughts: No  Homicidal Thoughts: Homicidal Thoughts: No   Sensorium  Memory: Immediate Good  Judgment: Fair  Insight: Fair   Art Therapist  Concentration: Good  Attention Span: Good  Recall: Good  Fund of Knowledge: Good  Language: Good   Psychomotor Activity  Psychomotor Activity: Psychomotor Activity: Normal   Assets  Assets: Communication Skills; Social Support; Housing; Leisure Time   Sleep  Sleep: Sleep: Fair Number of Hours of Sleep: 6.25      Physical Exam: General: Sitting comfortably. NAD. HEENT: Normocephalic, atraumatic, MMM, EMOI Lungs: no increased work of breathing noted Heart: no cyanosis Abdomen: Non distended Musculoskeletal: FROM. No obvious deformities Skin: Warm, dry, intact. No rashes noted Neuro: No obvious focal deficits.  Gait and station are normal  Review of Systems:  Constitutional: Negative.   HENT: Negative.    Eyes: Negative.   Respiratory: Negative.    Cardiovascular: Negative.   Gastrointestinal: Negative.   Genitourinary: Negative.   Skin: Negative.    Neurological: Negative.   Psychiatric/Behavioral:  Negative  Blood pressure 109/80, pulse 98, temperature 98.2 F (36.8 C), temperature source Oral, resp. rate 18, height 5' 5 (1.651 m), weight 72 kg, SpO2 100%. Body mass index is 26.43 kg/m.    Social History   Tobacco Use  Smoking Status Every Day   Types: Cigarettes  Smokeless Tobacco Never     Tobacco Cessation:  A prescription for an FDA approved medication for tobacco cessation was not prescribed because: Non-smoker   Blood Alcohol level:  Lab Results  Component Value Date   ETH 196 (H) 02/20/2023    Metabolic Disorder Labs:  Lab Results  Component Value Date   HGBA1C 5.5 02/21/2023   MPG 111.15 02/21/2023   No results found for: PROLACTIN  Lab Results  Component Value Date   CHOL 190 02/21/2023   TRIG 121 02/21/2023   HDL 58 02/21/2023   VLDL 24 02/21/2023   LDLCALC 108 (H) 02/21/2023      See Psychiatric Specialty Exam and Suicide Risk Assessment completed by Attending Physician prior to discharge.  Discharge destination: Home  Is patient on multiple antipsychotic therapies at discharge:  No  Has Patient had three or more failed trials of antipsychotic monotherapy by history: NA Recommended Plan for Multiple Antipsychotic Therapies: NA   Discharge Instructions     Diet - low sodium heart healthy   Complete by: As directed    Increase activity slowly   Complete by: As directed         Allergies as of 02/23/2023       Reactions   Penicillins    Has patient had a PCN reaction causing immediate rash, facial/tongue/throat swelling, SOB or lightheadedness with hypotension: No Has patient had a PCN reaction causing severe rash involving mucus membranes or skin necrosis: No Has patient had a PCN reaction that required hospitalization: No Has patient had a PCN reaction occurring within the last 10 years: No If all of the above answers are NO, then may proceed with Cephalosporin use.    Tetracyclines & Related    unknown   Venlafaxine    Other Reaction(s): Fatigue        Medication List     STOP taking these medications    acetaminophen  325 MG tablet Commonly known as: TYLENOL    benzonatate 100 MG capsule Commonly known as: TESSALON   clotrimazole 1 % cream Commonly known as: LOTRIMIN   clotrimazole 1 % external solution Commonly known as: LOTRIMIN   magnesium  oxide 400 (240 Mg) MG tablet Commonly known as: MAG-OX   meloxicam 15 MG tablet Commonly known as: MOBIC   PARoxetine 20 MG tablet Commonly known as: PAXIL       TAKE these medications      Indication  amLODipine  10 MG tablet Commonly known as: NORVASC  Take 10 mg by mouth daily.  Indication: High Blood Pressure   atorvastatin 40 MG tablet Commonly known as: LIPITOR Take 40 mg by mouth daily.  Indication: High Amount of Fats in the Blood   Cholecalciferol  125 MCG (5000 UT) Tabs Take 1 tablet (5,000 Units total) by mouth daily.  Indication: Vitamin D  Deficiency   haloperidol  5 MG tablet Commonly known as: HALDOL  Take 1 tablet (5 mg total) by mouth at bedtime.  Indication: Psychosis   olopatadine 0.1 % ophthalmic solution Commonly known as: PATANOL Place 1 drop into both eyes 2 (two) times daily.  Indication: Inflammation of Eyelid Lining due to Allergy   pantoprazole 40 MG tablet Commonly known as: PROTONIX Take 40 mg by mouth daily.  Indication: Heartburn   potassium chloride  SA 20 MEQ tablet Commonly known as: KLOR-CON  M Take 2 tablets (40 mEq total) by mouth daily. What changed: how much to take  Indication: Low Amount of Potassium in the Blood   Vitamin D  (Ergocalciferol ) 1.25 MG (50000 UNIT) Caps capsule Commonly known as: DRISDOL  Take 1 capsule (50,000 Units total) by mouth every 7 (seven) days. Start taking on: March 01, 2023  Indication: Vitamin D  Deficiency          Follow-up Information     [facility] Follow up.   Why: Per your  report, your appointment with therapist is scheduled for 02/25/23.  When you are there, please schedule an appointment for medication  management services. Contact information: [address] [place]  [phone]                   Follow-up recommendations:  - It is recommended to the patient to continue psychiatric medications as prescribed, after discharge from the hospital.   - It is recommended to the patient to follow up with your outpatient psychiatric provider and PCP. - It was discussed with the patient, the impact of alcohol, drugs, tobacco have been there overall psychiatric and medical wellbeing, and total abstinence from substance use was recommended the patient. - Prescriptions provided or sent directly to preferred pharmacy at discharge. Patient agreeable to plan. Given opportunity to ask questions. Appears to feel comfortable with discharge.   - In the event of worsening symptoms, the patient is instructed to call the crisis hotline, 911 and or go to the nearest ED for appropriate evaluation and treatment of symptoms. To follow-up with primary care provider for other medical issues, concerns and or health care needs - Patient was discharged home with a plan to follow up as noted above.   Comments:  NA  Signed: Glendia Kitty, MD 02/23/23 10:02 AM

## 2023-02-23 NOTE — [facility] Group Notes (Signed)
Pt did not attend goals group. 

## 2023-02-23 NOTE — Progress Notes (Signed)
   02/23/23 0800  Psych Admission Type (Psych Patients Only)  Admission Status Involuntary  Psychosocial Assessment  Patient Complaints None  Eye Contact Fair  Facial Expression Flat  Affect Appropriate to circumstance  Speech Logical/coherent  Interaction Assertive  Motor Activity Other (Comment) (steady gait)  Appearance/Hygiene Unremarkable  Behavior Characteristics Cooperative;Appropriate to situation  Mood Euthymic;Pleasant  Thought Process  Coherency WDL  Content WDL  Delusions None reported or observed  Perception WDL  Hallucination None reported or observed  Judgment WDL  Confusion None  Danger to Self  Current suicidal ideation? Denies  Danger to Others  Danger to Others None reported or observed

## 2023-02-23 NOTE — Progress Notes (Signed)
  [facility] Adult Case Management Discharge Plan :  Will you be returning to the same living situation after discharge:  Yes,  patient is going home At discharge, do you have transportation home?: Yes,  patients sons picked her up Do you have the ability to pay for your medications: Yes,  Patient has insurance  Release of information consent forms completed and in the chart;  Patient's signature needed at discharge.  Patient to Follow up at:  Follow-up Information     [facility] Follow up.   Why: Per your report, your appointment with therapist is scheduled for 02/25/23.  When you are there, please schedule an appointment for medication management services. Contact information: [address] [place]  [phone]                Next level of care provider has access to [facility] Link:no  Safety Planning and Suicide Prevention discussed: Yes,  CSW spome to son     Has patient been referred to the Quitline?: Patient refused referral for treatment  Patient has been referred for addiction treatment: Patient refused referral for treatment; referral information given to patient at discharge.  Beverly West O Miking Usrey, LCSWA 02/23/2023, 9:22 AM

## 2023-02-23 NOTE — Progress Notes (Signed)
   02/22/23 2143  Psych Admission Type (Psych Patients Only)  Admission Status Involuntary  Psychosocial Assessment  Patient Complaints None  Eye Contact Fair  Facial Expression Flat  Affect Anxious  Speech Logical/coherent  Interaction Assertive  Motor Activity Other (Comment) (WDL)  Appearance/Hygiene In scrubs  Behavior Characteristics Cooperative  Mood Anxious  Thought Process  Coherency WDL  Content WDL  Delusions None reported or observed  Perception WDL  Hallucination None reported or observed  Judgment Impaired  Confusion None  Danger to Self  Agreement Not to Harm Self Yes  Description of Agreement verbal  Danger to Others  Danger to Others None reported or observed

## 2023-06-05 IMAGING — DX DG FOOT COMPLETE 3+V*L*
3 series · 3 of 3 positions shown · non-contrast
Comparison: None.

CLINICAL DATA: Pain and swelling after hitting foot on table 2 days
ago.

EXAM:
LEFT FOOT - COMPLETE 3+ VIEW

[foot ap]
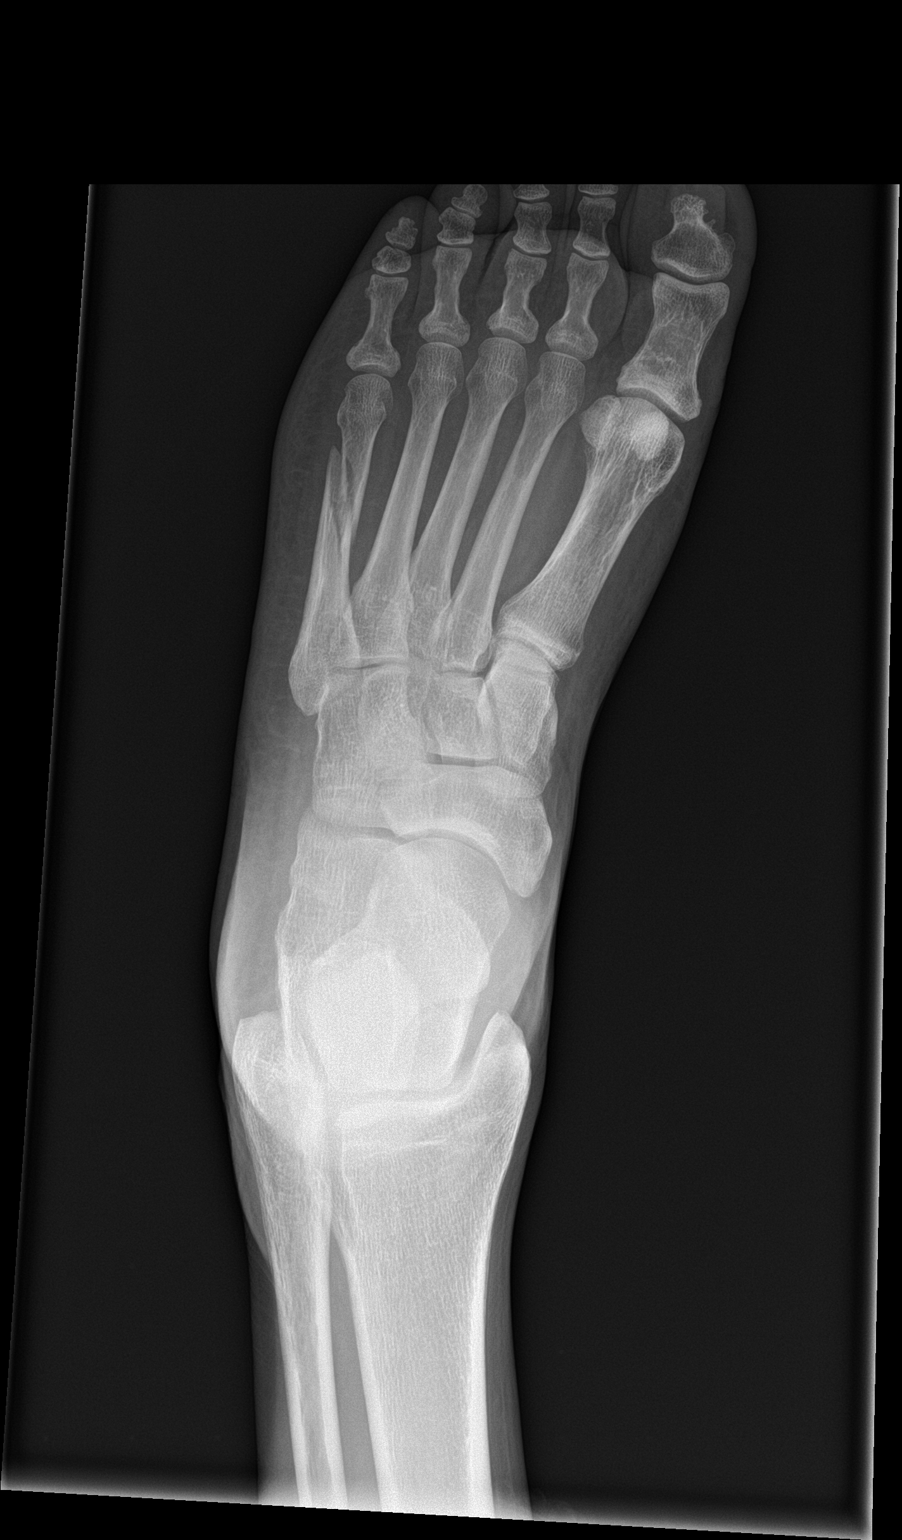

[foot obl]
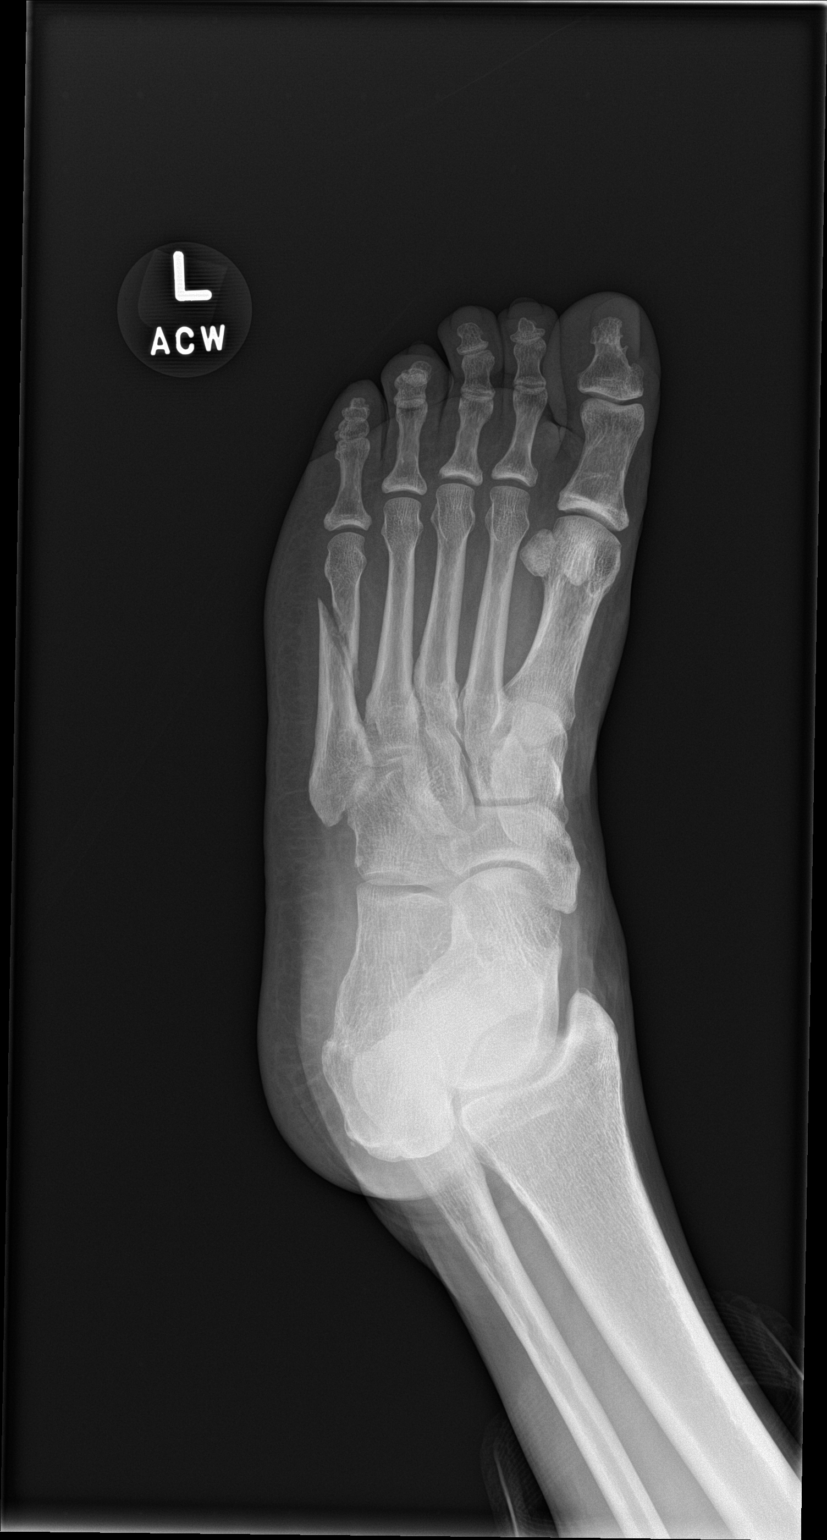

[foot lat]
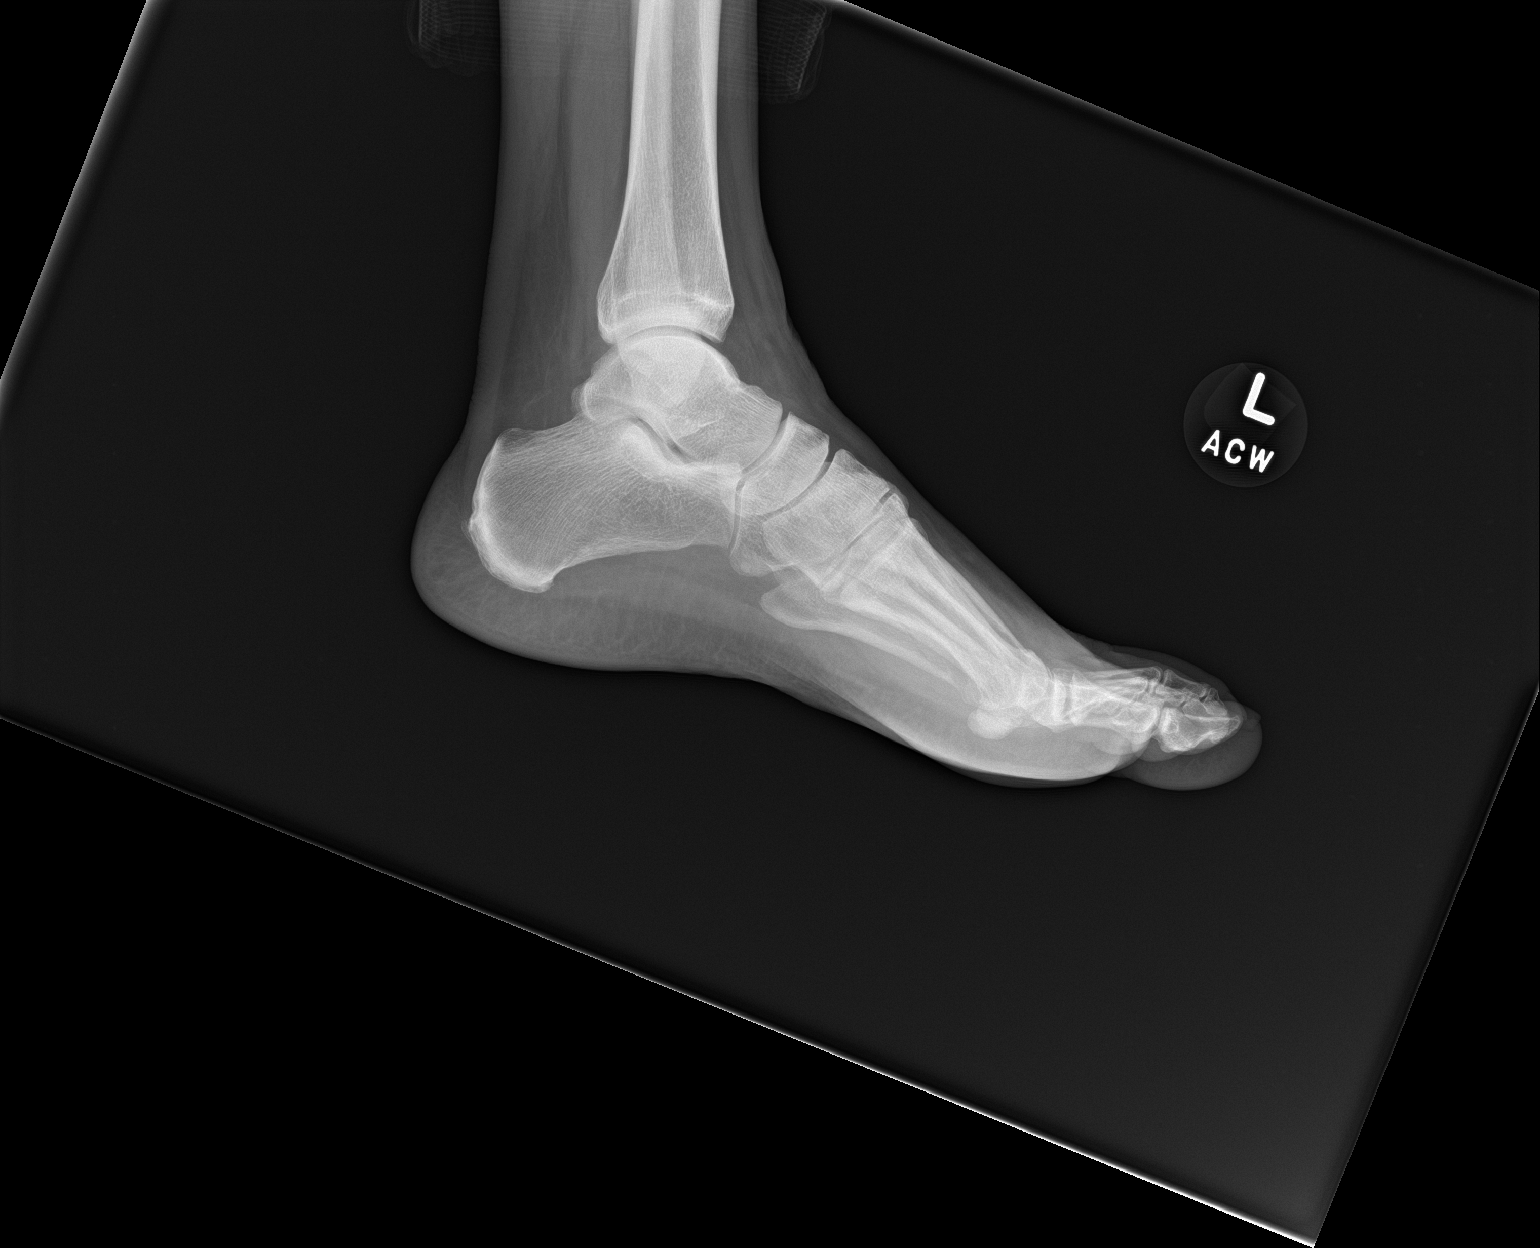

[3 of 3 positions shown; findings below may reference images not displayed]

FINDINGS: There is an acute, obliquely oriented fracture deformity involving
the mid and distal third of the fifth metatarsal bone. Mild medial
displacement of the distal fracture fragments identified. No
additional acute osseous findings.
IMPRESSION: Acute fracture involves the fifth metatarsal bone.
